# Patient Record
Sex: Female | Born: 1945 | Race: White | Hispanic: No | Marital: Married | Smoking: Never smoker
Health system: Southern US, Community
[De-identification: ages and names within clinical notes are randomized; demographics above are authoritative.]

## PROBLEM LIST (undated history)

## (undated) DIAGNOSIS — F329 Major depressive disorder, single episode, unspecified: Secondary | ICD-10-CM

## (undated) DIAGNOSIS — R7989 Other specified abnormal findings of blood chemistry: Secondary | ICD-10-CM

## (undated) DIAGNOSIS — E785 Hyperlipidemia, unspecified: Secondary | ICD-10-CM

## (undated) DIAGNOSIS — M1990 Unspecified osteoarthritis, unspecified site: Secondary | ICD-10-CM

## (undated) DIAGNOSIS — F32A Depression, unspecified: Secondary | ICD-10-CM

## (undated) DIAGNOSIS — M8580 Other specified disorders of bone density and structure, unspecified site: Secondary | ICD-10-CM

## (undated) DIAGNOSIS — Z9889 Other specified postprocedural states: Secondary | ICD-10-CM

## (undated) DIAGNOSIS — R112 Nausea with vomiting, unspecified: Secondary | ICD-10-CM

## (undated) DIAGNOSIS — S5290XA Unspecified fracture of unspecified forearm, initial encounter for closed fracture: Secondary | ICD-10-CM

## (undated) DIAGNOSIS — R945 Abnormal results of liver function studies: Secondary | ICD-10-CM

## (undated) HISTORY — PX: JOINT REPLACEMENT: SHX530

## (undated) HISTORY — PX: ABDOMINAL HYSTERECTOMY: SHX81

## (undated) HISTORY — PX: WRIST SURGERY: SHX841

---

## 1974-02-13 HISTORY — PX: MYOMECTOMY: SHX85

## 2010-01-03 ENCOUNTER — Inpatient Hospital Stay (HOSPITAL_COMMUNITY): Admission: RE | Admit: 2010-01-03 | Discharge: 2010-01-05 | Payer: Self-pay | Admitting: Orthopedic Surgery

## 2010-04-18 NOTE — Discharge Summary (Signed)
  [name], [name]              ACCOUNT NO.:  [id number]  MEDICAL RECORD NO.:  [id number]          PATIENT TYPE:  INP  LOCATION:  1618                         FACILITY:  [facility]  PHYSICIAN:  Kayla Frankel. Charlann Waters, M.D.  DATE OF BIRTH:  1945-12-23  DATE OF ADMISSION:  01/03/2010 DATE OF DISCHARGE:  01/05/2010                              DISCHARGE SUMMARY   ADMITTING DIAGNOSIS:  Left knee osteoarthritis.  DISCHARGE DIAGNOSES: 1. Left knee osteoarthritis. 2. Anxiety. 3. Depression. 4. Reflux disease.  ADMITTING HISTORY:  Kayla Waters is a 65 year old female with history of left knee osteoarthritis who failed conservative measures.  She was ready to proceed with left total knee replacement.  Risks and benefits were discussed, reviewed as well as postoperative course and expectations.  HOSPITAL COURSE:  The patient was admitted for same-day surgery for a left total knee replacement.  Please see dictated operative note for full details of the procedure.  Postoperatively, she was in the recovery room for routine stay and then transferred to orthopedic floor where she will remain for hospital stay.  Postop day #1, she was noted to have stable electrolytes and labs.  Her Hemovac drain was removed.  She was able to already do a straight leg raise.  She was seen and evaluated by Physical Therapy.  Foley removed.  She was placed on a regular diet. Other than expected, postoperative pain, she was okay.  She was seen and evaluated by a spiritual healer on her postoperative day #1 visit for repositioning and pain control.  On postop day #2 on 01/05/2010, she was ready for discharge.  She was doing well.  Her knee incision was evaluated and found to be dry.  Her hematocrit was 31.5. Electrolytes were stable.  After physical therapy, she be discharged home with the regular diet.  DISCHARGE INSTRUCTIONS:  Patient will discharged home on a regular diet. She is to follow up with [facility], with Dr. Raylene Everts in 2 weeks. She will keep her wound dry until followup.  She will be seen evaluated by home health physical therapy.  DISCHARGE MEDICATIONS: 1. Colace 100 mg p.o. b.i.d. as needed for constipation. 2. Iron sulfate 325 mg 2 to 3 times a day as tolerated for couple of     weeks for anemia. 3. Dilaudid 2 mg 1 to 2 tablets every 4 hours as needed for pain. 4. Robaxin 500 mg p.o. 6 hours as needed for muscle spasm pain. 5. MiraLax 17 g p.o. daily as needed for constipation. 6. Xarelto 10 mg p.o. daily as needed for anticoagulation. 7. Ultram 50 mg 1 to 2 tablets every 4 as needed for pain. 8. Dexlansoprazole 60 mg everyday. 9. Lexapro 10 mg q.a.m. 10.Lunesta 2 mg p.o. q.h.s. as needed. 11.Vitamin D3 daily.  Any orthopedic questions can be addressed to [facility]. Any medical issues can be addressed via her husband.     Kayla Waters, M.D.     MDO/MEDQ  D:  04/15/2010  T:  04/16/2010  Job:  [id number]  Electronically Signed by Durene Romans M.D. on 04/18/2010 09:16:30 AM

## 2010-04-26 LAB — BASIC METABOLIC PANEL
BUN: 15 mg/dL (ref 6–23)
BUN: 5 mg/dL — ABNORMAL LOW (ref 6–23)
BUN: 9 mg/dL (ref 6–23)
CO2: 28 mEq/L (ref 19–32)
CO2: 30 mEq/L (ref 19–32)
Calcium: 9 mg/dL (ref 8.4–10.5)
Calcium: 9.8 mg/dL (ref 8.4–10.5)
Chloride: 102 mEq/L (ref 96–112)
GFR calc non Af Amer: >60 mL/min (ref >60)
GFR calc non Af Amer: >60 mL/min (ref >60)
Glucose, Bld: 108 mg/dL — ABNORMAL HIGH (ref 70–99)
Glucose, Bld: 111 mg/dL — ABNORMAL HIGH (ref 70–99)
Potassium: 4.1 mEq/L (ref 3.5–5.1)
Potassium: 4.4 mEq/L (ref 3.5–5.1)
Sodium: 137 mEq/L (ref 135–145)
Sodium: 141 mEq/L (ref 135–145)

## 2010-04-26 LAB — CBC
HCT: 32.5 % — ABNORMAL LOW (ref 36.0–46.0)
HCT: 41.8 % (ref 36.0–46.0)
Hemoglobin: 11 g/dL — ABNORMAL LOW (ref 12.0–15.0)
Hemoglobin: 14.3 g/dL (ref 12.0–15.0)
MCH: 30.4 pg (ref 26.0–34.0)
MCH: 30.6 pg (ref 26.0–34.0)
MCHC: 33.9 g/dL (ref 30.0–36.0)
MCHC: 34.3 g/dL (ref 30.0–36.0)
MCV: 89.5 fL (ref 78.0–100.0)
RDW: 14.4 % (ref 11.5–15.5)
RDW: 14.6 % (ref 11.5–15.5)
RDW: 15 % (ref 11.5–15.5)
WBC: 6 10*3/uL (ref 4.0–10.5)

## 2010-04-26 LAB — PROTIME-INR
INR: 0.92 (ref 0.00–1.49)
Prothrombin Time: 12.6 seconds (ref 11.6–15.2)

## 2010-04-26 LAB — URINALYSIS, ROUTINE W REFLEX MICROSCOPIC
Nitrite: NEGATIVE
Specific Gravity, Urine: 1.012 (ref 1.005–1.030)
pH: 7 (ref 5.0–8.0)

## 2010-04-26 LAB — APTT: aPTT: 28 seconds (ref 24–37)

## 2010-04-26 LAB — DIFFERENTIAL
Basophils Absolute: 0.1 10*3/uL (ref 0.0–0.1)
Lymphocytes Relative: 30 % (ref 12–46)
Monocytes Relative: 8 % (ref 3–12)
Neutro Abs: 3.5 10*3/uL (ref 1.7–7.7)
Neutrophils Relative %: 59 % (ref 43–77)

## 2010-04-26 LAB — TYPE AND SCREEN

## 2010-04-26 LAB — SURGICAL PCR SCREEN: MRSA, PCR: NEGATIVE

## 2011-03-10 DIAGNOSIS — M1710 Unilateral primary osteoarthritis, unspecified knee: Secondary | ICD-10-CM | POA: Diagnosis not present

## 2011-03-10 DIAGNOSIS — M25569 Pain in unspecified knee: Secondary | ICD-10-CM | POA: Diagnosis not present

## 2011-03-11 DIAGNOSIS — M25469 Effusion, unspecified knee: Secondary | ICD-10-CM | POA: Diagnosis not present

## 2011-03-11 DIAGNOSIS — M1710 Unilateral primary osteoarthritis, unspecified knee: Secondary | ICD-10-CM | POA: Diagnosis not present

## 2011-03-17 DIAGNOSIS — M1710 Unilateral primary osteoarthritis, unspecified knee: Secondary | ICD-10-CM | POA: Diagnosis not present

## 2011-03-20 DIAGNOSIS — M1710 Unilateral primary osteoarthritis, unspecified knee: Secondary | ICD-10-CM | POA: Diagnosis not present

## 2011-03-23 DIAGNOSIS — M1710 Unilateral primary osteoarthritis, unspecified knee: Secondary | ICD-10-CM | POA: Diagnosis not present

## 2011-04-07 DIAGNOSIS — M1710 Unilateral primary osteoarthritis, unspecified knee: Secondary | ICD-10-CM | POA: Diagnosis not present

## 2011-04-10 DIAGNOSIS — M1710 Unilateral primary osteoarthritis, unspecified knee: Secondary | ICD-10-CM | POA: Diagnosis not present

## 2011-04-14 DIAGNOSIS — M1710 Unilateral primary osteoarthritis, unspecified knee: Secondary | ICD-10-CM | POA: Diagnosis not present

## 2011-04-17 DIAGNOSIS — M1710 Unilateral primary osteoarthritis, unspecified knee: Secondary | ICD-10-CM | POA: Diagnosis not present

## 2011-04-21 DIAGNOSIS — M1710 Unilateral primary osteoarthritis, unspecified knee: Secondary | ICD-10-CM | POA: Diagnosis not present

## 2011-04-24 DIAGNOSIS — M1710 Unilateral primary osteoarthritis, unspecified knee: Secondary | ICD-10-CM | POA: Diagnosis not present

## 2011-04-28 DIAGNOSIS — M1710 Unilateral primary osteoarthritis, unspecified knee: Secondary | ICD-10-CM | POA: Diagnosis not present

## 2011-04-28 DIAGNOSIS — M6580 Other synovitis and tenosynovitis, unspecified site: Secondary | ICD-10-CM | POA: Diagnosis not present

## 2011-06-13 DIAGNOSIS — M899 Disorder of bone, unspecified: Secondary | ICD-10-CM | POA: Diagnosis not present

## 2011-06-13 DIAGNOSIS — Z79899 Other long term (current) drug therapy: Secondary | ICD-10-CM | POA: Diagnosis not present

## 2011-06-13 DIAGNOSIS — R7989 Other specified abnormal findings of blood chemistry: Secondary | ICD-10-CM | POA: Diagnosis not present

## 2011-06-13 DIAGNOSIS — E559 Vitamin D deficiency, unspecified: Secondary | ICD-10-CM | POA: Diagnosis not present

## 2011-06-20 DIAGNOSIS — E559 Vitamin D deficiency, unspecified: Secondary | ICD-10-CM | POA: Diagnosis not present

## 2011-06-20 DIAGNOSIS — M1710 Unilateral primary osteoarthritis, unspecified knee: Secondary | ICD-10-CM | POA: Diagnosis not present

## 2011-06-20 DIAGNOSIS — J309 Allergic rhinitis, unspecified: Secondary | ICD-10-CM | POA: Diagnosis not present

## 2011-06-20 DIAGNOSIS — R7989 Other specified abnormal findings of blood chemistry: Secondary | ICD-10-CM | POA: Diagnosis not present

## 2012-02-28 DIAGNOSIS — M899 Disorder of bone, unspecified: Secondary | ICD-10-CM | POA: Diagnosis not present

## 2012-02-28 DIAGNOSIS — R3989 Other symptoms and signs involving the genitourinary system: Secondary | ICD-10-CM | POA: Diagnosis not present

## 2012-02-28 DIAGNOSIS — Z79899 Other long term (current) drug therapy: Secondary | ICD-10-CM | POA: Diagnosis not present

## 2012-02-28 DIAGNOSIS — R7989 Other specified abnormal findings of blood chemistry: Secondary | ICD-10-CM | POA: Diagnosis not present

## 2012-02-28 DIAGNOSIS — K8020 Calculus of gallbladder without cholecystitis without obstruction: Secondary | ICD-10-CM | POA: Diagnosis not present

## 2012-02-28 DIAGNOSIS — E559 Vitamin D deficiency, unspecified: Secondary | ICD-10-CM | POA: Diagnosis not present

## 2012-02-28 DIAGNOSIS — K7689 Other specified diseases of liver: Secondary | ICD-10-CM | POA: Diagnosis not present

## 2012-04-08 DIAGNOSIS — Z1231 Encounter for screening mammogram for malignant neoplasm of breast: Secondary | ICD-10-CM | POA: Diagnosis not present

## 2012-05-26 DIAGNOSIS — J019 Acute sinusitis, unspecified: Secondary | ICD-10-CM | POA: Diagnosis not present

## 2013-03-26 DIAGNOSIS — M79609 Pain in unspecified limb: Secondary | ICD-10-CM | POA: Diagnosis not present

## 2013-03-26 DIAGNOSIS — M19049 Primary osteoarthritis, unspecified hand: Secondary | ICD-10-CM | POA: Diagnosis not present

## 2013-03-28 DIAGNOSIS — R509 Fever, unspecified: Secondary | ICD-10-CM | POA: Diagnosis not present

## 2013-04-07 DIAGNOSIS — M79609 Pain in unspecified limb: Secondary | ICD-10-CM | POA: Diagnosis not present

## 2013-04-14 DIAGNOSIS — M19049 Primary osteoarthritis, unspecified hand: Secondary | ICD-10-CM | POA: Diagnosis not present

## 2013-04-14 DIAGNOSIS — G5600 Carpal tunnel syndrome, unspecified upper limb: Secondary | ICD-10-CM | POA: Diagnosis not present

## 2013-04-14 DIAGNOSIS — M79609 Pain in unspecified limb: Secondary | ICD-10-CM | POA: Diagnosis not present

## 2013-06-02 DIAGNOSIS — G5600 Carpal tunnel syndrome, unspecified upper limb: Secondary | ICD-10-CM | POA: Diagnosis not present

## 2013-06-02 DIAGNOSIS — M79609 Pain in unspecified limb: Secondary | ICD-10-CM | POA: Diagnosis not present

## 2013-10-03 DIAGNOSIS — R229 Localized swelling, mass and lump, unspecified: Secondary | ICD-10-CM | POA: Diagnosis not present

## 2013-10-03 DIAGNOSIS — G5600 Carpal tunnel syndrome, unspecified upper limb: Secondary | ICD-10-CM | POA: Diagnosis not present

## 2013-10-14 DIAGNOSIS — M6740 Ganglion, unspecified site: Secondary | ICD-10-CM | POA: Diagnosis not present

## 2013-10-14 DIAGNOSIS — G5600 Carpal tunnel syndrome, unspecified upper limb: Secondary | ICD-10-CM | POA: Diagnosis not present

## 2013-10-14 DIAGNOSIS — D492 Neoplasm of unspecified behavior of bone, soft tissue, and skin: Secondary | ICD-10-CM | POA: Diagnosis not present

## 2013-10-14 DIAGNOSIS — G8918 Other acute postprocedural pain: Secondary | ICD-10-CM | POA: Diagnosis not present

## 2013-11-03 DIAGNOSIS — G5600 Carpal tunnel syndrome, unspecified upper limb: Secondary | ICD-10-CM | POA: Diagnosis not present

## 2013-11-07 DIAGNOSIS — K8020 Calculus of gallbladder without cholecystitis without obstruction: Secondary | ICD-10-CM | POA: Diagnosis not present

## 2013-11-07 DIAGNOSIS — E049 Nontoxic goiter, unspecified: Secondary | ICD-10-CM | POA: Diagnosis not present

## 2013-11-07 DIAGNOSIS — Z13220 Encounter for screening for lipoid disorders: Secondary | ICD-10-CM | POA: Diagnosis not present

## 2013-11-07 DIAGNOSIS — Z0000 Encounter for general adult medical examination without abnormal findings: Secondary | ICD-10-CM | POA: Diagnosis not present

## 2013-11-07 DIAGNOSIS — R3919 Other difficulties with micturition: Secondary | ICD-10-CM | POA: Diagnosis not present

## 2013-11-07 DIAGNOSIS — F329 Major depressive disorder, single episode, unspecified: Secondary | ICD-10-CM | POA: Diagnosis not present

## 2013-11-07 DIAGNOSIS — R7989 Other specified abnormal findings of blood chemistry: Secondary | ICD-10-CM | POA: Diagnosis not present

## 2013-11-07 DIAGNOSIS — E559 Vitamin D deficiency, unspecified: Secondary | ICD-10-CM | POA: Diagnosis not present

## 2013-11-07 DIAGNOSIS — Z23 Encounter for immunization: Secondary | ICD-10-CM | POA: Diagnosis not present

## 2013-11-08 DIAGNOSIS — G5600 Carpal tunnel syndrome, unspecified upper limb: Secondary | ICD-10-CM | POA: Diagnosis not present

## 2013-11-14 ENCOUNTER — Other Ambulatory Visit: Payer: Self-pay | Admitting: Family Medicine

## 2013-11-14 DIAGNOSIS — E049 Nontoxic goiter, unspecified: Secondary | ICD-10-CM

## 2013-11-14 DIAGNOSIS — G5602 Carpal tunnel syndrome, left upper limb: Secondary | ICD-10-CM | POA: Diagnosis not present

## 2013-11-17 DIAGNOSIS — G5602 Carpal tunnel syndrome, left upper limb: Secondary | ICD-10-CM | POA: Diagnosis not present

## 2013-11-18 ENCOUNTER — Other Ambulatory Visit: Payer: Self-pay | Admitting: Family Medicine

## 2013-11-18 DIAGNOSIS — Z1231 Encounter for screening mammogram for malignant neoplasm of breast: Secondary | ICD-10-CM

## 2013-11-19 ENCOUNTER — Other Ambulatory Visit: Payer: Self-pay

## 2013-11-20 DIAGNOSIS — Z1211 Encounter for screening for malignant neoplasm of colon: Secondary | ICD-10-CM | POA: Diagnosis not present

## 2013-11-20 DIAGNOSIS — G5602 Carpal tunnel syndrome, left upper limb: Secondary | ICD-10-CM | POA: Diagnosis not present

## 2013-11-24 ENCOUNTER — Ambulatory Visit
Admission: RE | Admit: 2013-11-24 | Discharge: 2013-11-24 | Disposition: A | Discharge status: Home / Self Care | Payer: Medicare Other | Source: Ambulatory Visit | Attending: Family Medicine | Admitting: Family Medicine

## 2013-11-24 DIAGNOSIS — E041 Nontoxic single thyroid nodule: Secondary | ICD-10-CM | POA: Diagnosis not present

## 2013-11-24 DIAGNOSIS — E049 Nontoxic goiter, unspecified: Secondary | ICD-10-CM

## 2013-11-28 DIAGNOSIS — G5602 Carpal tunnel syndrome, left upper limb: Secondary | ICD-10-CM | POA: Diagnosis not present

## 2013-12-11 ENCOUNTER — Ambulatory Visit: Payer: Self-pay

## 2013-12-25 ENCOUNTER — Ambulatory Visit
Admission: RE | Admit: 2013-12-25 | Discharge: 2013-12-25 | Disposition: A | Discharge status: Home / Self Care | Payer: Medicare Other | Source: Ambulatory Visit | Attending: Family Medicine | Admitting: Family Medicine

## 2013-12-25 ENCOUNTER — Encounter (INDEPENDENT_AMBULATORY_CARE_PROVIDER_SITE_OTHER): Payer: Self-pay

## 2013-12-25 DIAGNOSIS — Z1231 Encounter for screening mammogram for malignant neoplasm of breast: Secondary | ICD-10-CM

## 2014-01-15 DIAGNOSIS — R3912 Poor urinary stream: Secondary | ICD-10-CM | POA: Diagnosis not present

## 2014-10-12 DIAGNOSIS — Z1211 Encounter for screening for malignant neoplasm of colon: Secondary | ICD-10-CM | POA: Diagnosis not present

## 2015-03-13 IMAGING — US US SOFT TISSUE HEAD/NECK
1 series · 14 of 25 positions shown · non-contrast
Comparison: None.

CLINICAL DATA: 68-year-old female

EXAM:
THYROID ULTRASOUND
TECHNIQUE: Ultrasound examination of the thyroid gland and adjacent soft
tissues was performed.

[Series 1: us soft tissue head/neck · 0.06mm/px · 14 of 34 slices shown]
[im 1/34]
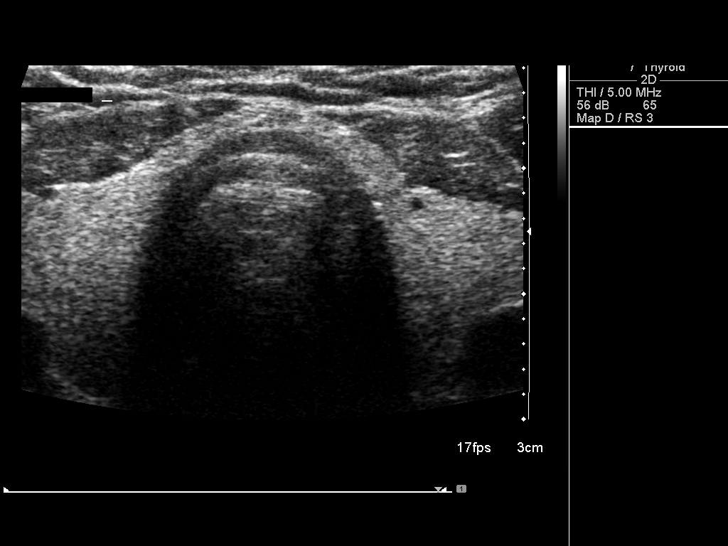
[im 3/34]
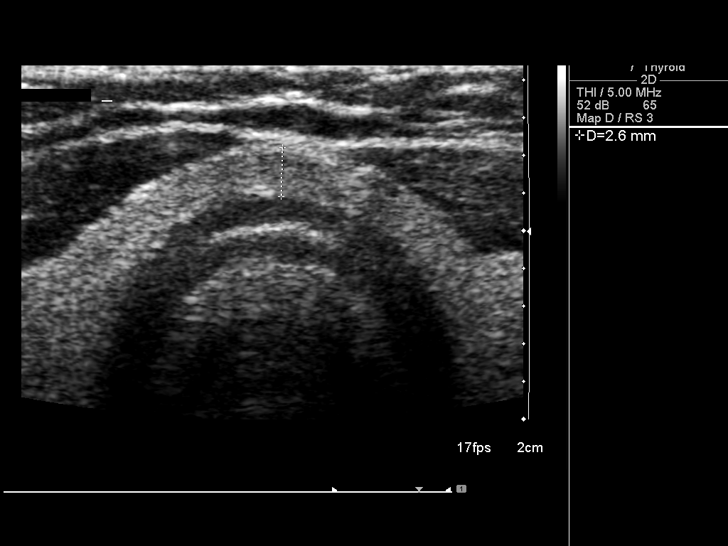
[im 6/34]
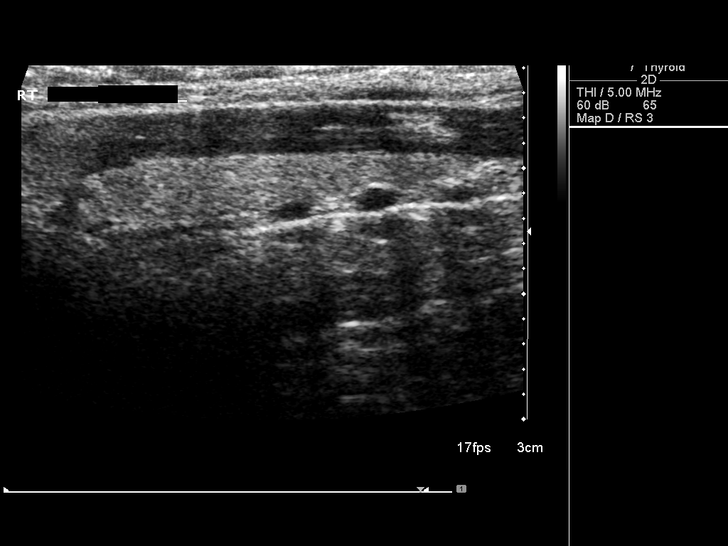
[im 9/34]
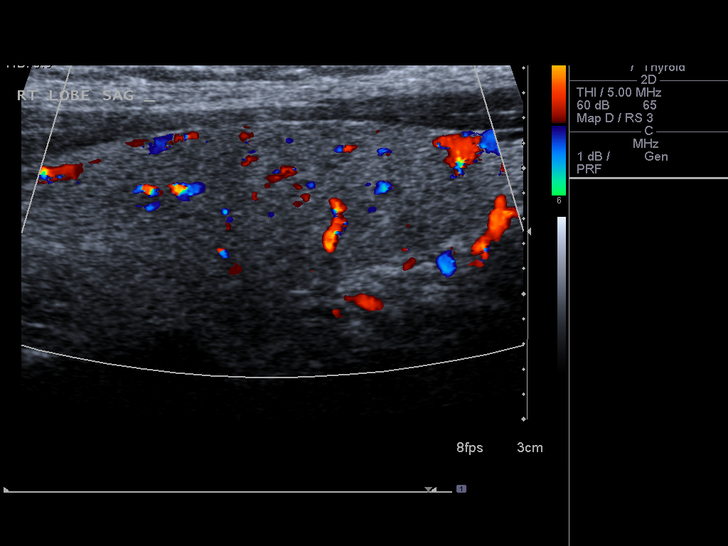
[im 12/34]
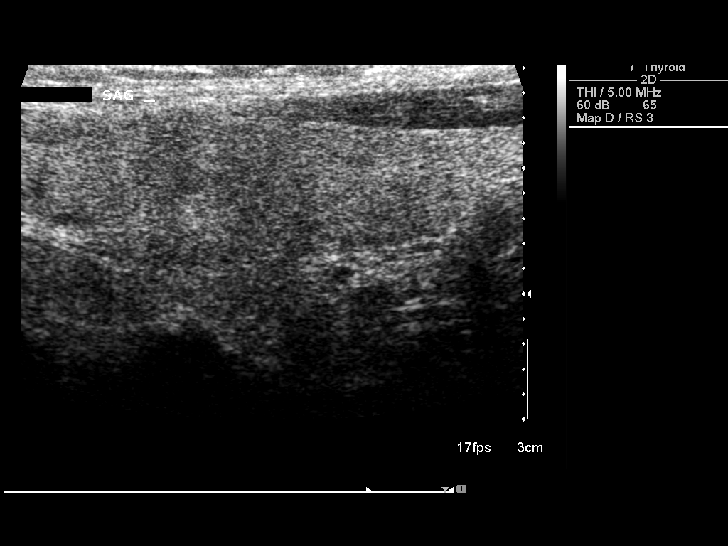
[im 13/34]
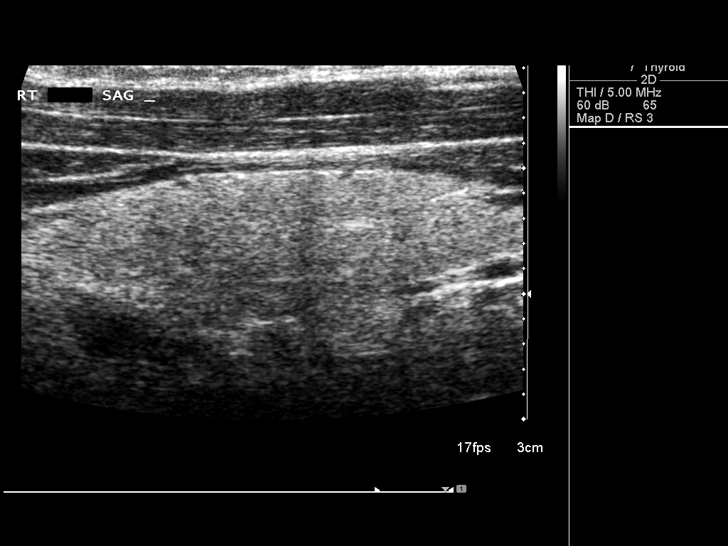
[im 16/34]
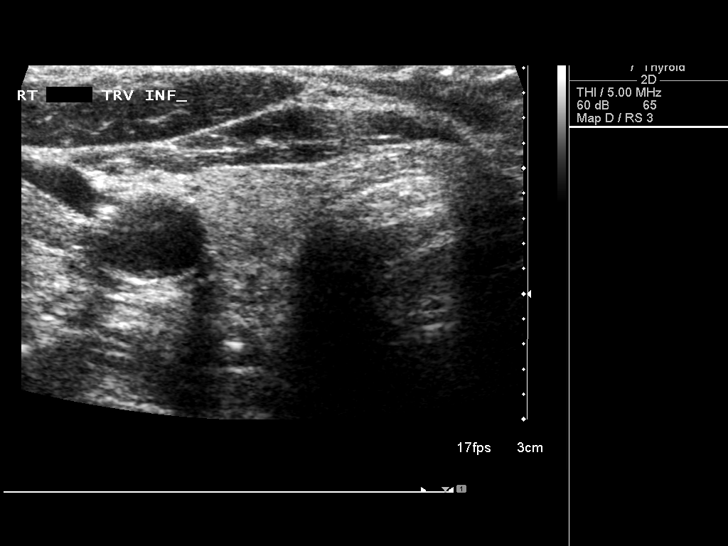
[im 18/34]
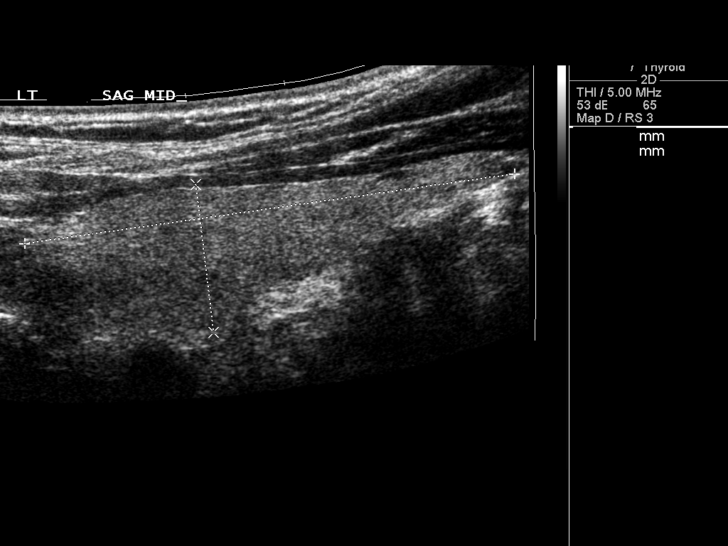
[im 21/34]
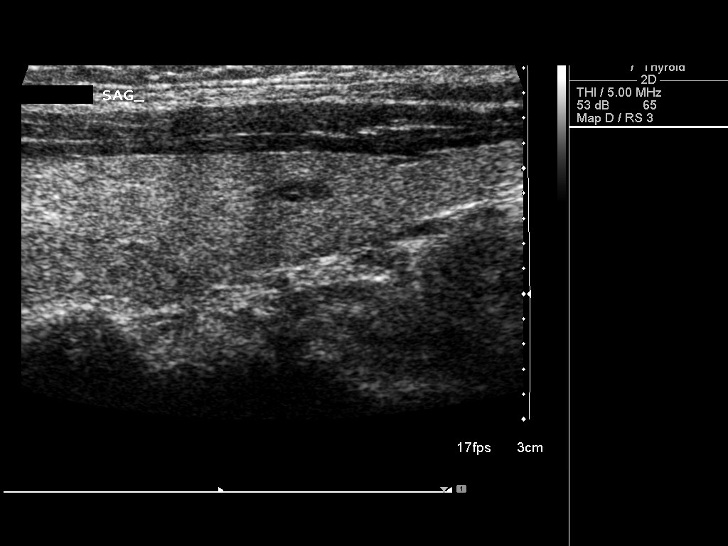
[im 23/34]
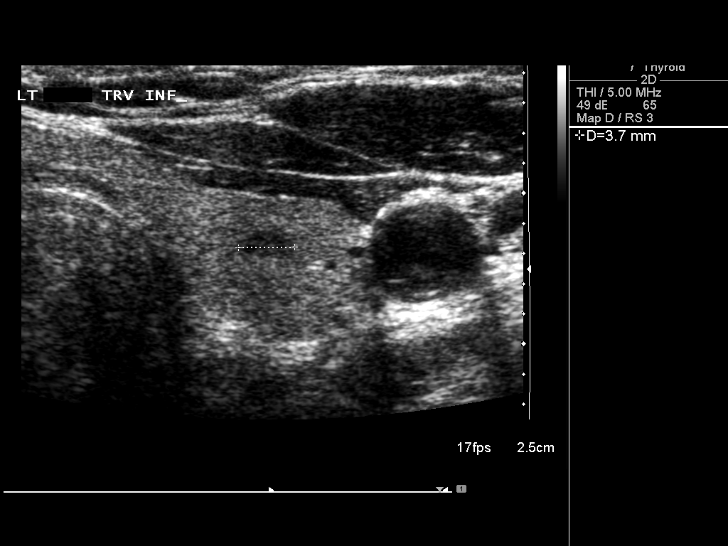
[im 25/34]
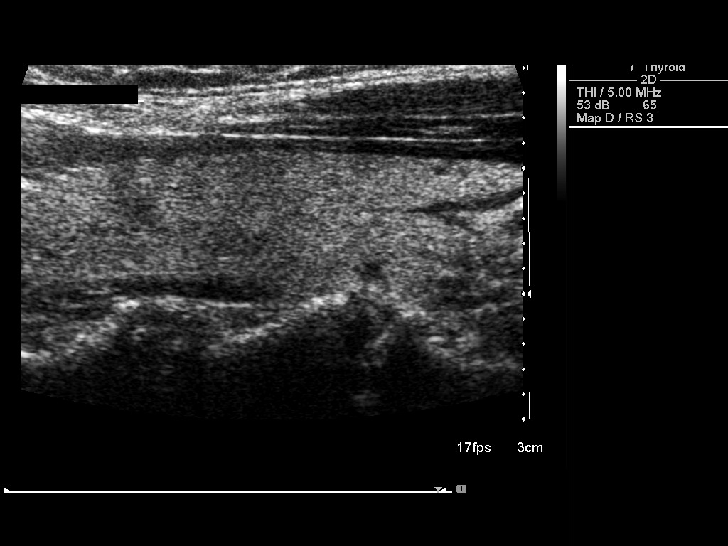
[im 28/34]
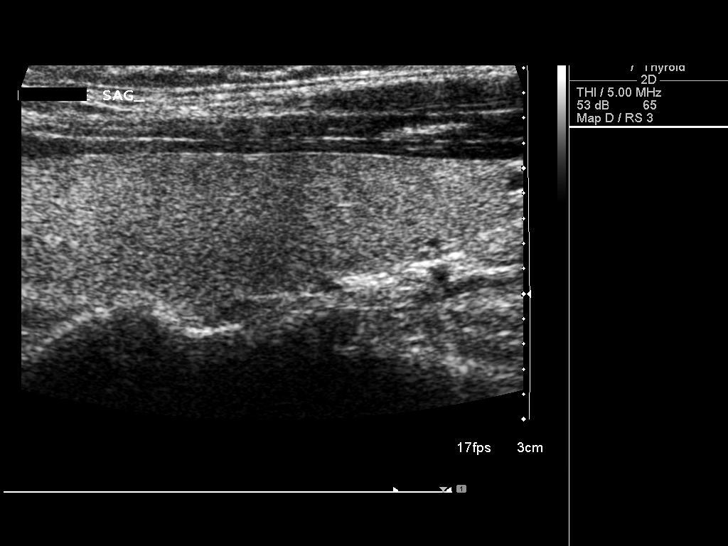
[im 31/34]
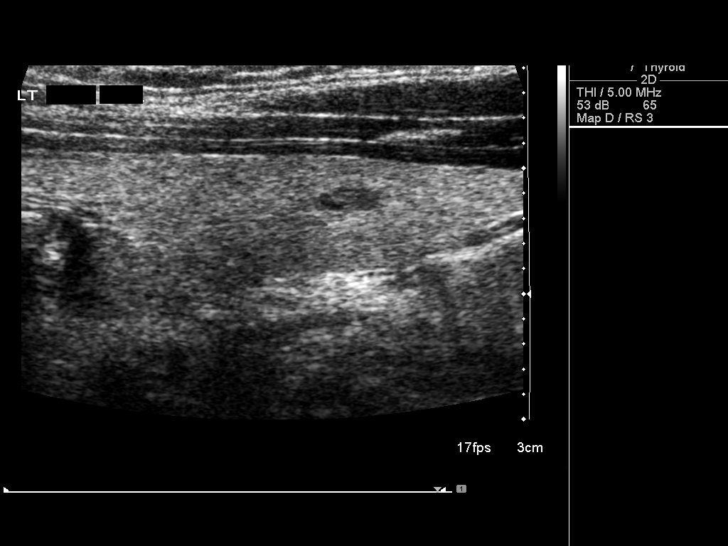
[im 34/34]
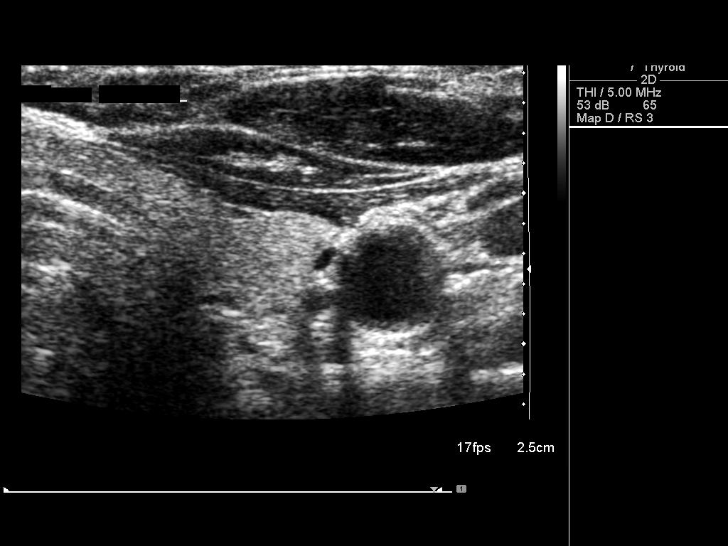

[14 of 25 positions shown; findings below may reference images not displayed]

FINDINGS: Right thyroid lobe

Measurements: 5.3 cm, 1.0 cm, 1.7 cm.  No nodules visualized.

Left thyroid lobe

Measurements: 5.0 cm, 1.5 cm, 1.4 cm. Single hypoechoic sub cm
nodule on the inferior pole of left thyroid measuring 4 mm x 2 mm x
4 mm.

Isthmus

Thickness: 3 mm.  No nodules visualized.

Lymphadenopathy

None visualized.
IMPRESSION: Single left-sided nodule measuring 4 mm without suspicious features.

Findings do not meet current SRU consensus criteria for biopsy.
Follow-up by clinical exam is recommended. If patient has known risk
factors for thyroid carcinoma, consider follow-up ultrasound in 12
months. If patient is clinically hyperthyroid, consider nuclear
medicine thyroid uptake and scan.Reference: Management of Thyroid
Nodules Detected at US: Society of Radiologists in Ultrasound

## 2015-04-16 DIAGNOSIS — E049 Nontoxic goiter, unspecified: Secondary | ICD-10-CM | POA: Diagnosis not present

## 2015-04-16 DIAGNOSIS — Z0000 Encounter for general adult medical examination without abnormal findings: Secondary | ICD-10-CM | POA: Diagnosis not present

## 2015-04-16 DIAGNOSIS — E559 Vitamin D deficiency, unspecified: Secondary | ICD-10-CM | POA: Diagnosis not present

## 2015-04-16 DIAGNOSIS — K7581 Nonalcoholic steatohepatitis (NASH): Secondary | ICD-10-CM | POA: Diagnosis not present

## 2015-04-16 DIAGNOSIS — E7800 Pure hypercholesterolemia, unspecified: Secondary | ICD-10-CM | POA: Diagnosis not present

## 2015-04-16 DIAGNOSIS — M179 Osteoarthritis of knee, unspecified: Secondary | ICD-10-CM | POA: Diagnosis not present

## 2015-04-16 DIAGNOSIS — Z23 Encounter for immunization: Secondary | ICD-10-CM | POA: Diagnosis not present

## 2015-04-16 DIAGNOSIS — M8580 Other specified disorders of bone density and structure, unspecified site: Secondary | ICD-10-CM | POA: Diagnosis not present

## 2015-04-16 DIAGNOSIS — F322 Major depressive disorder, single episode, severe without psychotic features: Secondary | ICD-10-CM | POA: Diagnosis not present

## 2015-04-19 ENCOUNTER — Other Ambulatory Visit: Payer: Self-pay | Admitting: Family Medicine

## 2015-04-19 DIAGNOSIS — E049 Nontoxic goiter, unspecified: Secondary | ICD-10-CM

## 2015-04-19 DIAGNOSIS — Z1231 Encounter for screening mammogram for malignant neoplasm of breast: Secondary | ICD-10-CM

## 2015-04-19 DIAGNOSIS — M8580 Other specified disorders of bone density and structure, unspecified site: Secondary | ICD-10-CM

## 2015-05-13 ENCOUNTER — Ambulatory Visit
Admission: RE | Admit: 2015-05-13 | Discharge: 2015-05-13 | Disposition: A | Discharge status: Home / Self Care | Payer: Medicare Other | Source: Ambulatory Visit | Attending: Family Medicine | Admitting: Family Medicine

## 2015-05-13 DIAGNOSIS — Z1231 Encounter for screening mammogram for malignant neoplasm of breast: Secondary | ICD-10-CM

## 2015-05-13 DIAGNOSIS — Z780 Asymptomatic menopausal state: Secondary | ICD-10-CM | POA: Diagnosis not present

## 2015-05-13 DIAGNOSIS — E049 Nontoxic goiter, unspecified: Secondary | ICD-10-CM

## 2015-05-13 DIAGNOSIS — E041 Nontoxic single thyroid nodule: Secondary | ICD-10-CM | POA: Diagnosis not present

## 2015-05-13 DIAGNOSIS — M8580 Other specified disorders of bone density and structure, unspecified site: Secondary | ICD-10-CM

## 2015-05-13 DIAGNOSIS — M8589 Other specified disorders of bone density and structure, multiple sites: Secondary | ICD-10-CM | POA: Diagnosis not present

## 2015-05-17 ENCOUNTER — Other Ambulatory Visit: Payer: Self-pay | Admitting: Family Medicine

## 2015-05-17 DIAGNOSIS — R928 Other abnormal and inconclusive findings on diagnostic imaging of breast: Secondary | ICD-10-CM

## 2015-05-25 ENCOUNTER — Ambulatory Visit
Admission: RE | Admit: 2015-05-25 | Discharge: 2015-05-25 | Disposition: A | Discharge status: Home / Self Care | Payer: Medicare Other | Source: Ambulatory Visit | Attending: Family Medicine | Admitting: Family Medicine

## 2015-05-25 DIAGNOSIS — R928 Other abnormal and inconclusive findings on diagnostic imaging of breast: Secondary | ICD-10-CM

## 2015-05-25 DIAGNOSIS — R922 Inconclusive mammogram: Secondary | ICD-10-CM | POA: Diagnosis not present

## 2015-08-27 DIAGNOSIS — M542 Cervicalgia: Secondary | ICD-10-CM | POA: Diagnosis not present

## 2015-09-11 ENCOUNTER — Other Ambulatory Visit: Payer: Self-pay | Admitting: Family Medicine

## 2015-09-11 DIAGNOSIS — M542 Cervicalgia: Secondary | ICD-10-CM

## 2015-09-18 DIAGNOSIS — M50821 Other cervical disc disorders at C4-C5 level: Secondary | ICD-10-CM | POA: Diagnosis not present

## 2015-09-18 DIAGNOSIS — M50823 Other cervical disc disorders at C6-C7 level: Secondary | ICD-10-CM | POA: Diagnosis not present

## 2015-09-18 DIAGNOSIS — M50822 Other cervical disc disorders at C5-C6 level: Secondary | ICD-10-CM | POA: Diagnosis not present

## 2015-09-25 ENCOUNTER — Ambulatory Visit
Admission: RE | Admit: 2015-09-25 | Discharge: 2015-09-25 | Disposition: A | Discharge status: Home / Self Care | Payer: Medicare Other | Source: Ambulatory Visit | Attending: Family Medicine | Admitting: Family Medicine

## 2015-09-25 DIAGNOSIS — M50221 Other cervical disc displacement at C4-C5 level: Secondary | ICD-10-CM | POA: Diagnosis not present

## 2015-09-25 DIAGNOSIS — M542 Cervicalgia: Secondary | ICD-10-CM

## 2015-09-25 DIAGNOSIS — M50223 Other cervical disc displacement at C6-C7 level: Secondary | ICD-10-CM | POA: Diagnosis not present

## 2015-09-25 DIAGNOSIS — M50222 Other cervical disc displacement at C5-C6 level: Secondary | ICD-10-CM | POA: Diagnosis not present

## 2015-10-15 DIAGNOSIS — M50821 Other cervical disc disorders at C4-C5 level: Secondary | ICD-10-CM | POA: Diagnosis not present

## 2015-10-15 DIAGNOSIS — M50822 Other cervical disc disorders at C5-C6 level: Secondary | ICD-10-CM | POA: Diagnosis not present

## 2015-10-15 DIAGNOSIS — M50823 Other cervical disc disorders at C6-C7 level: Secondary | ICD-10-CM | POA: Diagnosis not present

## 2015-10-19 DIAGNOSIS — M50822 Other cervical disc disorders at C5-C6 level: Secondary | ICD-10-CM | POA: Diagnosis not present

## 2015-10-19 DIAGNOSIS — M50823 Other cervical disc disorders at C6-C7 level: Secondary | ICD-10-CM | POA: Diagnosis not present

## 2015-10-19 DIAGNOSIS — M50821 Other cervical disc disorders at C4-C5 level: Secondary | ICD-10-CM | POA: Diagnosis not present

## 2015-10-22 DIAGNOSIS — M50823 Other cervical disc disorders at C6-C7 level: Secondary | ICD-10-CM | POA: Diagnosis not present

## 2015-10-22 DIAGNOSIS — M50822 Other cervical disc disorders at C5-C6 level: Secondary | ICD-10-CM | POA: Diagnosis not present

## 2015-10-22 DIAGNOSIS — M50821 Other cervical disc disorders at C4-C5 level: Secondary | ICD-10-CM | POA: Diagnosis not present

## 2015-10-25 DIAGNOSIS — M50823 Other cervical disc disorders at C6-C7 level: Secondary | ICD-10-CM | POA: Diagnosis not present

## 2015-10-25 DIAGNOSIS — M50821 Other cervical disc disorders at C4-C5 level: Secondary | ICD-10-CM | POA: Diagnosis not present

## 2015-10-25 DIAGNOSIS — M50822 Other cervical disc disorders at C5-C6 level: Secondary | ICD-10-CM | POA: Diagnosis not present

## 2015-10-27 DIAGNOSIS — M50823 Other cervical disc disorders at C6-C7 level: Secondary | ICD-10-CM | POA: Diagnosis not present

## 2015-10-27 DIAGNOSIS — M50822 Other cervical disc disorders at C5-C6 level: Secondary | ICD-10-CM | POA: Diagnosis not present

## 2015-10-27 DIAGNOSIS — M50821 Other cervical disc disorders at C4-C5 level: Secondary | ICD-10-CM | POA: Diagnosis not present

## 2015-10-29 DIAGNOSIS — M50821 Other cervical disc disorders at C4-C5 level: Secondary | ICD-10-CM | POA: Diagnosis not present

## 2015-10-29 DIAGNOSIS — M50822 Other cervical disc disorders at C5-C6 level: Secondary | ICD-10-CM | POA: Diagnosis not present

## 2015-10-29 DIAGNOSIS — M50823 Other cervical disc disorders at C6-C7 level: Secondary | ICD-10-CM | POA: Diagnosis not present

## 2015-11-01 DIAGNOSIS — M50821 Other cervical disc disorders at C4-C5 level: Secondary | ICD-10-CM | POA: Diagnosis not present

## 2015-11-01 DIAGNOSIS — M50823 Other cervical disc disorders at C6-C7 level: Secondary | ICD-10-CM | POA: Diagnosis not present

## 2015-11-01 DIAGNOSIS — M50822 Other cervical disc disorders at C5-C6 level: Secondary | ICD-10-CM | POA: Diagnosis not present

## 2015-11-03 DIAGNOSIS — M50823 Other cervical disc disorders at C6-C7 level: Secondary | ICD-10-CM | POA: Diagnosis not present

## 2015-11-03 DIAGNOSIS — M50821 Other cervical disc disorders at C4-C5 level: Secondary | ICD-10-CM | POA: Diagnosis not present

## 2015-11-03 DIAGNOSIS — M50822 Other cervical disc disorders at C5-C6 level: Secondary | ICD-10-CM | POA: Diagnosis not present

## 2015-11-05 DIAGNOSIS — M50823 Other cervical disc disorders at C6-C7 level: Secondary | ICD-10-CM | POA: Diagnosis not present

## 2015-11-05 DIAGNOSIS — M50822 Other cervical disc disorders at C5-C6 level: Secondary | ICD-10-CM | POA: Diagnosis not present

## 2015-11-05 DIAGNOSIS — M50821 Other cervical disc disorders at C4-C5 level: Secondary | ICD-10-CM | POA: Diagnosis not present

## 2015-11-29 DIAGNOSIS — M50822 Other cervical disc disorders at C5-C6 level: Secondary | ICD-10-CM | POA: Diagnosis not present

## 2015-11-29 DIAGNOSIS — M50821 Other cervical disc disorders at C4-C5 level: Secondary | ICD-10-CM | POA: Diagnosis not present

## 2015-11-29 DIAGNOSIS — M50823 Other cervical disc disorders at C6-C7 level: Secondary | ICD-10-CM | POA: Diagnosis not present

## 2015-12-01 DIAGNOSIS — M8580 Other specified disorders of bone density and structure, unspecified site: Secondary | ICD-10-CM | POA: Diagnosis not present

## 2015-12-01 DIAGNOSIS — M50821 Other cervical disc disorders at C4-C5 level: Secondary | ICD-10-CM | POA: Diagnosis not present

## 2015-12-01 DIAGNOSIS — K7581 Nonalcoholic steatohepatitis (NASH): Secondary | ICD-10-CM | POA: Diagnosis not present

## 2015-12-01 DIAGNOSIS — F322 Major depressive disorder, single episode, severe without psychotic features: Secondary | ICD-10-CM | POA: Diagnosis not present

## 2015-12-01 DIAGNOSIS — E559 Vitamin D deficiency, unspecified: Secondary | ICD-10-CM | POA: Diagnosis not present

## 2015-12-01 DIAGNOSIS — M47812 Spondylosis without myelopathy or radiculopathy, cervical region: Secondary | ICD-10-CM | POA: Diagnosis not present

## 2016-02-10 DIAGNOSIS — M25562 Pain in left knee: Secondary | ICD-10-CM | POA: Diagnosis not present

## 2016-02-10 DIAGNOSIS — Z96652 Presence of left artificial knee joint: Secondary | ICD-10-CM | POA: Diagnosis not present

## 2016-02-10 DIAGNOSIS — M25561 Pain in right knee: Secondary | ICD-10-CM | POA: Diagnosis not present

## 2016-02-10 DIAGNOSIS — M25551 Pain in right hip: Secondary | ICD-10-CM | POA: Diagnosis not present

## 2016-02-10 DIAGNOSIS — Z471 Aftercare following joint replacement surgery: Secondary | ICD-10-CM | POA: Diagnosis not present

## 2016-03-06 DIAGNOSIS — M25561 Pain in right knee: Secondary | ICD-10-CM | POA: Diagnosis not present

## 2016-03-06 DIAGNOSIS — M5136 Other intervertebral disc degeneration, lumbar region: Secondary | ICD-10-CM | POA: Diagnosis not present

## 2016-03-06 DIAGNOSIS — M1711 Unilateral primary osteoarthritis, right knee: Secondary | ICD-10-CM | POA: Diagnosis not present

## 2016-03-06 DIAGNOSIS — M7061 Trochanteric bursitis, right hip: Secondary | ICD-10-CM | POA: Diagnosis not present

## 2016-05-04 DIAGNOSIS — M1711 Unilateral primary osteoarthritis, right knee: Secondary | ICD-10-CM | POA: Diagnosis not present

## 2016-05-04 DIAGNOSIS — G8929 Other chronic pain: Secondary | ICD-10-CM | POA: Diagnosis not present

## 2016-05-04 DIAGNOSIS — M25561 Pain in right knee: Secondary | ICD-10-CM | POA: Diagnosis not present

## 2016-05-24 ENCOUNTER — Other Ambulatory Visit: Payer: Self-pay | Admitting: Family Medicine

## 2016-05-24 DIAGNOSIS — Z1231 Encounter for screening mammogram for malignant neoplasm of breast: Secondary | ICD-10-CM

## 2016-06-02 DIAGNOSIS — M1711 Unilateral primary osteoarthritis, right knee: Secondary | ICD-10-CM | POA: Diagnosis not present

## 2016-06-16 ENCOUNTER — Ambulatory Visit
Admission: RE | Admit: 2016-06-16 | Discharge: 2016-06-16 | Disposition: A | Discharge status: Home / Self Care | Payer: Medicare Other | Source: Ambulatory Visit | Attending: Family Medicine | Admitting: Family Medicine

## 2016-06-16 DIAGNOSIS — Z1231 Encounter for screening mammogram for malignant neoplasm of breast: Secondary | ICD-10-CM

## 2016-06-22 DIAGNOSIS — R21 Rash and other nonspecific skin eruption: Secondary | ICD-10-CM | POA: Diagnosis not present

## 2016-06-22 DIAGNOSIS — E559 Vitamin D deficiency, unspecified: Secondary | ICD-10-CM | POA: Diagnosis not present

## 2016-06-22 DIAGNOSIS — M8580 Other specified disorders of bone density and structure, unspecified site: Secondary | ICD-10-CM | POA: Diagnosis not present

## 2016-06-22 DIAGNOSIS — E041 Nontoxic single thyroid nodule: Secondary | ICD-10-CM | POA: Diagnosis not present

## 2016-06-22 DIAGNOSIS — Z0000 Encounter for general adult medical examination without abnormal findings: Secondary | ICD-10-CM | POA: Diagnosis not present

## 2016-06-22 DIAGNOSIS — E2839 Other primary ovarian failure: Secondary | ICD-10-CM | POA: Diagnosis not present

## 2016-06-22 DIAGNOSIS — Z23 Encounter for immunization: Secondary | ICD-10-CM | POA: Diagnosis not present

## 2016-06-22 DIAGNOSIS — F322 Major depressive disorder, single episode, severe without psychotic features: Secondary | ICD-10-CM | POA: Diagnosis not present

## 2016-06-22 DIAGNOSIS — N951 Menopausal and female climacteric states: Secondary | ICD-10-CM | POA: Diagnosis not present

## 2016-06-22 DIAGNOSIS — R252 Cramp and spasm: Secondary | ICD-10-CM | POA: Diagnosis not present

## 2016-06-22 DIAGNOSIS — K7581 Nonalcoholic steatohepatitis (NASH): Secondary | ICD-10-CM | POA: Diagnosis not present

## 2016-06-26 ENCOUNTER — Other Ambulatory Visit: Payer: Self-pay | Admitting: Family Medicine

## 2016-06-26 DIAGNOSIS — D485 Neoplasm of uncertain behavior of skin: Secondary | ICD-10-CM | POA: Diagnosis not present

## 2016-06-26 DIAGNOSIS — Z8639 Personal history of other endocrine, nutritional and metabolic disease: Secondary | ICD-10-CM

## 2016-06-26 DIAGNOSIS — L814 Other melanin hyperpigmentation: Secondary | ICD-10-CM | POA: Diagnosis not present

## 2016-06-26 DIAGNOSIS — L821 Other seborrheic keratosis: Secondary | ICD-10-CM | POA: Diagnosis not present

## 2016-07-03 ENCOUNTER — Other Ambulatory Visit: Payer: Medicare Other

## 2016-07-12 DIAGNOSIS — R748 Abnormal levels of other serum enzymes: Secondary | ICD-10-CM | POA: Diagnosis not present

## 2016-07-12 DIAGNOSIS — R7989 Other specified abnormal findings of blood chemistry: Secondary | ICD-10-CM | POA: Diagnosis not present

## 2016-07-12 DIAGNOSIS — R197 Diarrhea, unspecified: Secondary | ICD-10-CM | POA: Diagnosis not present

## 2016-07-17 ENCOUNTER — Other Ambulatory Visit: Payer: Self-pay | Admitting: Family Medicine

## 2016-07-17 DIAGNOSIS — R748 Abnormal levels of other serum enzymes: Secondary | ICD-10-CM

## 2016-07-27 ENCOUNTER — Other Ambulatory Visit: Payer: Medicare Other

## 2016-08-01 ENCOUNTER — Ambulatory Visit
Admission: RE | Admit: 2016-08-01 | Discharge: 2016-08-01 | Disposition: A | Discharge status: Home / Self Care | Payer: Medicare Other | Source: Ambulatory Visit | Attending: Family Medicine | Admitting: Family Medicine

## 2016-08-01 DIAGNOSIS — R748 Abnormal levels of other serum enzymes: Secondary | ICD-10-CM

## 2016-08-01 DIAGNOSIS — Z8639 Personal history of other endocrine, nutritional and metabolic disease: Secondary | ICD-10-CM

## 2016-08-01 DIAGNOSIS — K8020 Calculus of gallbladder without cholecystitis without obstruction: Secondary | ICD-10-CM | POA: Diagnosis not present

## 2016-08-01 DIAGNOSIS — E042 Nontoxic multinodular goiter: Secondary | ICD-10-CM | POA: Diagnosis not present

## 2016-08-03 DIAGNOSIS — M25561 Pain in right knee: Secondary | ICD-10-CM | POA: Diagnosis not present

## 2016-08-03 DIAGNOSIS — G8929 Other chronic pain: Secondary | ICD-10-CM | POA: Diagnosis not present

## 2016-08-03 DIAGNOSIS — M1711 Unilateral primary osteoarthritis, right knee: Secondary | ICD-10-CM | POA: Diagnosis not present

## 2016-08-29 IMAGING — US US SOFT TISSUE HEAD/NECK
1 series · 13 of 25 positions shown · non-contrast
Comparison: Thyroid ultrasound 11/24/2013

CLINICAL DATA: 69-year-old female with thyroid goiter

EXAM:
THYROID ULTRASOUND
TECHNIQUE: Ultrasound examination of the thyroid gland and adjacent soft
tissues was performed.

[Series 1: us soft tissue head/neck · 0.06mm/px · 13 of 44 slices shown]
[im 1/44]
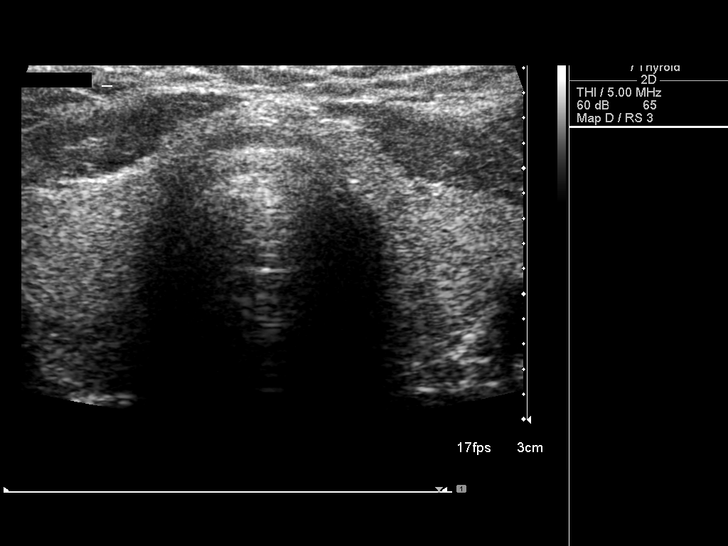
[im 4/44]
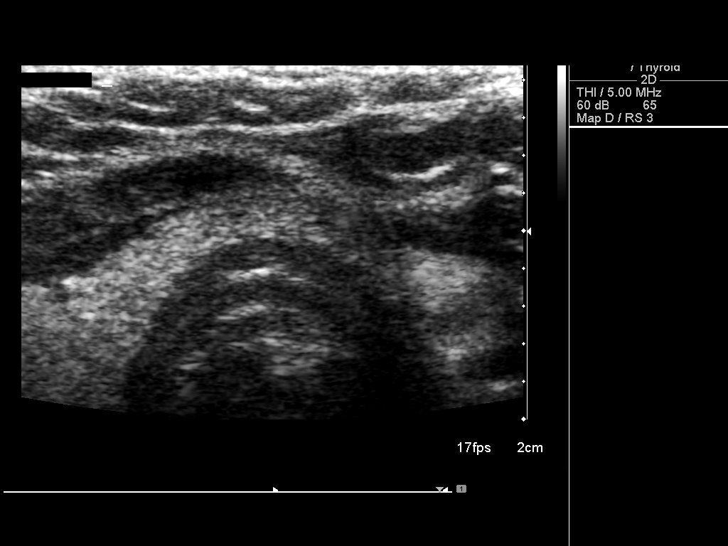
[im 8/44]
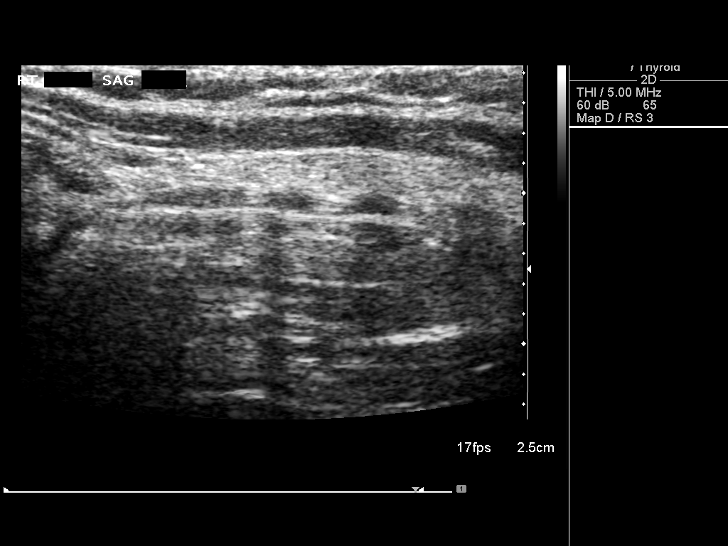
[im 11/44]
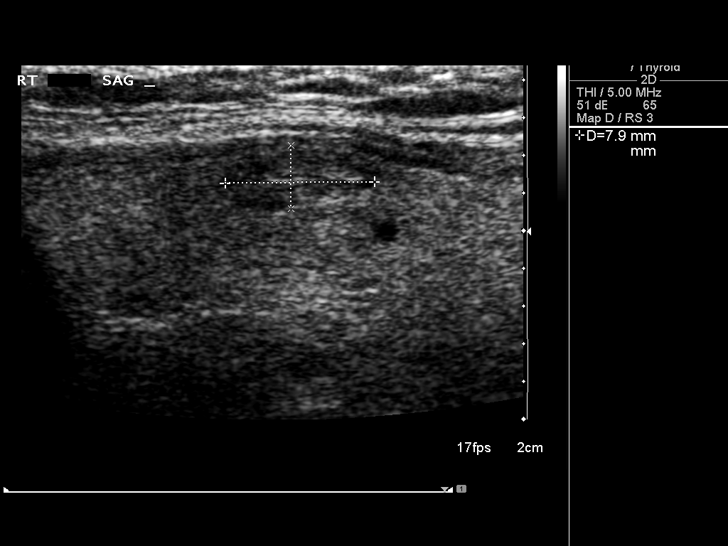
[im 15/44]
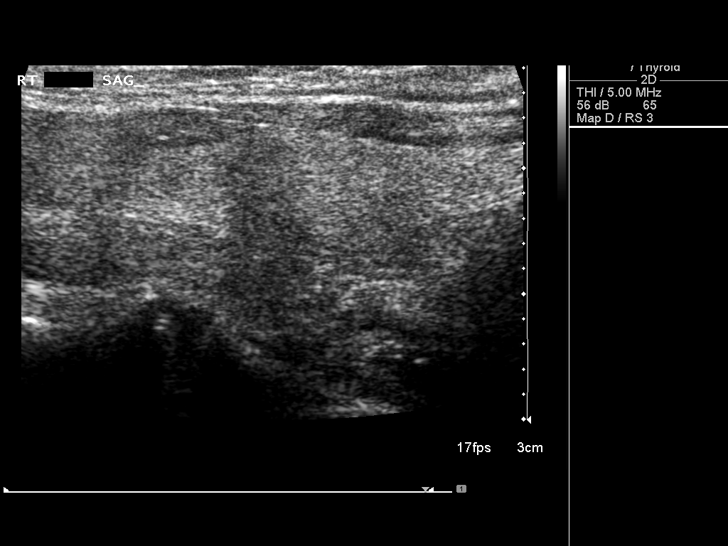
[im 18/44]
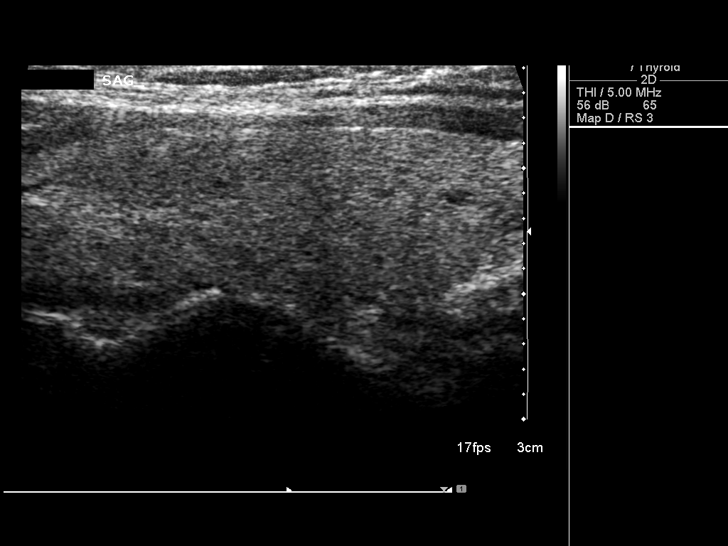
[im 22/44]
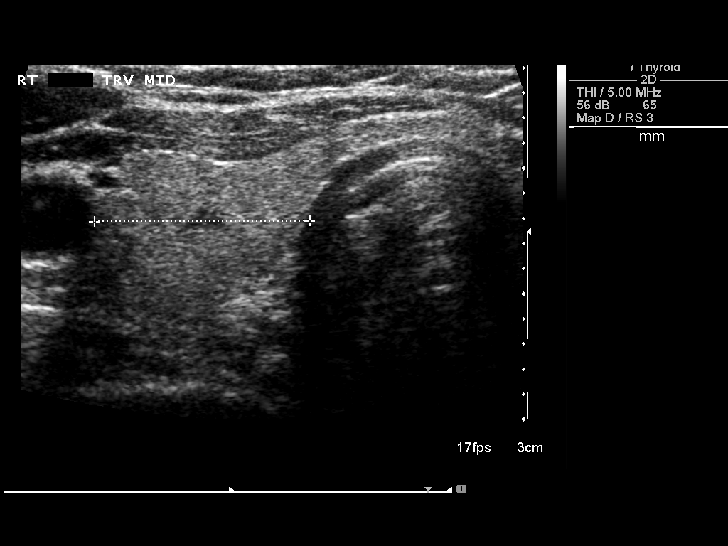
[im 26/44]
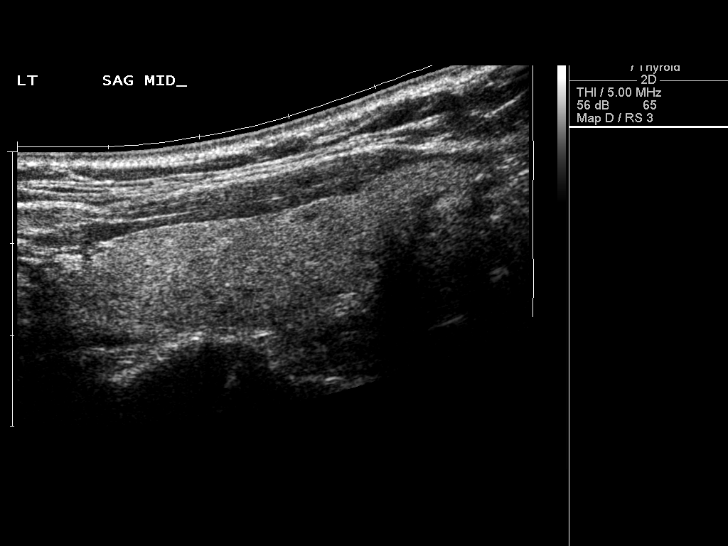
[im 29/44]
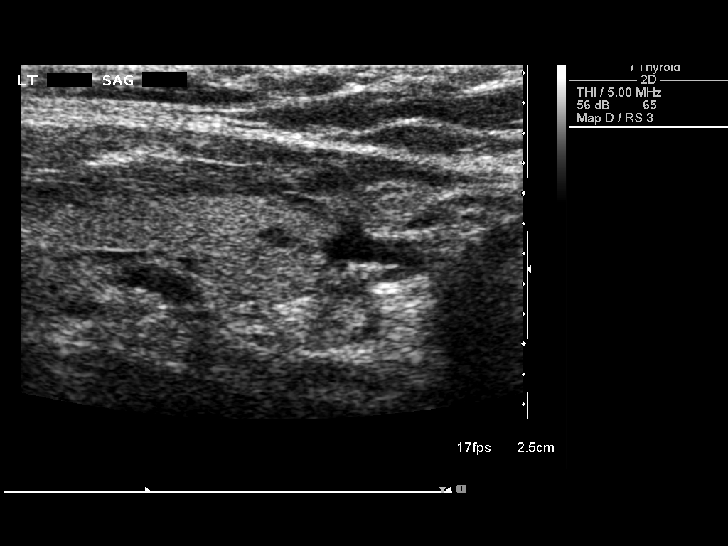
[im 33/44]
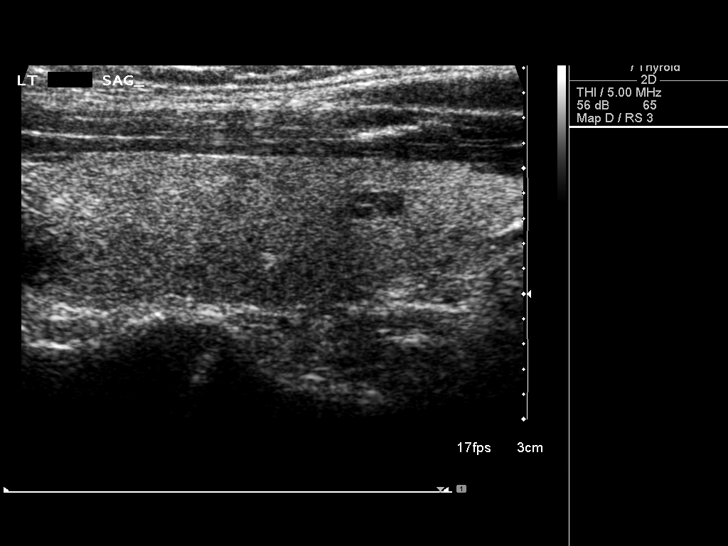
[im 36/44]
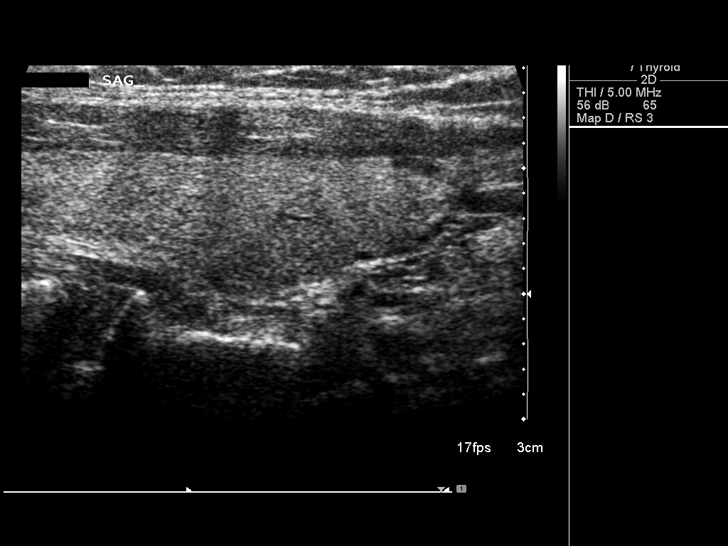
[im 40/44]
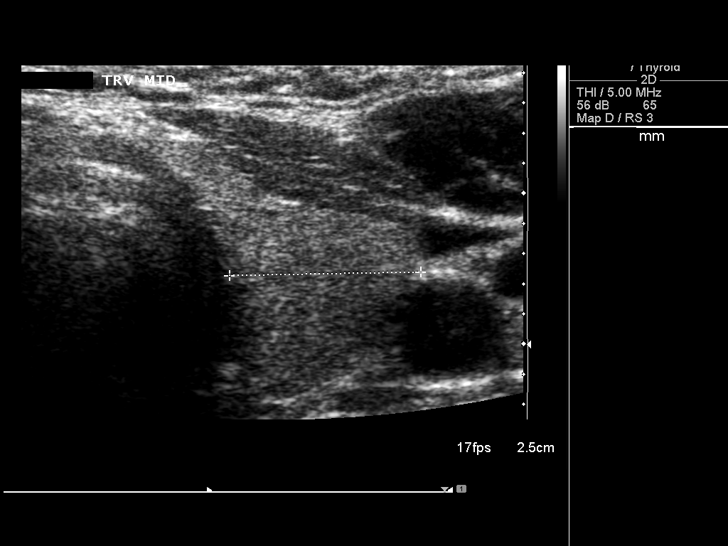
[im 44/44]
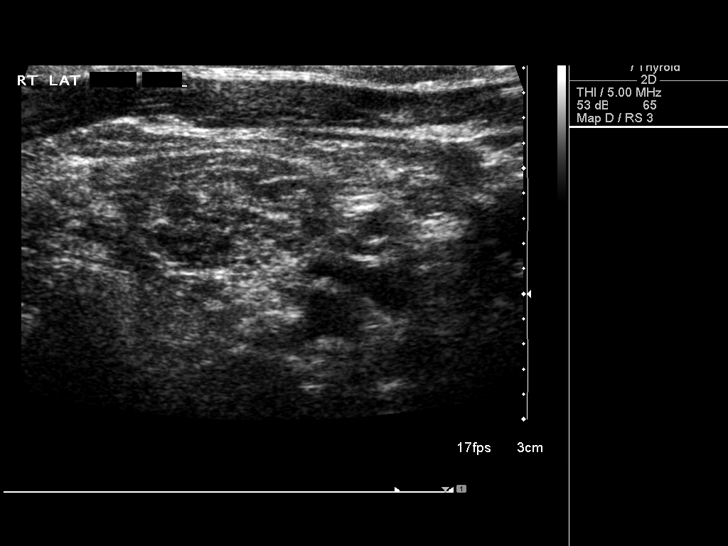

[13 of 25 positions shown; findings below may reference images not displayed]

FINDINGS: Right thyroid lobe

Measurements: 6.5 x 1.7 x 1.7 cm. Small hypoechoic nodule in the
right upper gland measures 0.8 x 0.3 x 0.5 cm. This nodule was not
present on the prior study.

Left thyroid lobe

Measurements: 4.8 x 1.4 x 1.3 cm. Stable hypoechoic 4 mm nodule in
the lower pole of the left gland. No interval change.

Isthmus

Thickness: 0.3 cm.  No nodules visualized.

Lymphadenopathy

None visualized.
IMPRESSION: 1. Stable 4 mm nodule in the lower pole of the left thyroid.
2. Hypoechoic solid 8 mm nodule in the upper pole of the right
thyroid gland was not definitively seen on the prior study.
Findings do not meet current SRU consensus criteria for biopsy.
Follow-up by clinical exam is recommended. If patient has known risk
factors for thyroid carcinoma, consider follow-up ultrasound in 12
months. If patient is clinically hyperthyroid, consider nuclear
medicine thyroid uptake and scan.Reference: Management of Thyroid
Nodules Detected at US: Society of Radiologists in Ultrasound

## 2016-10-10 DIAGNOSIS — M1711 Unilateral primary osteoarthritis, right knee: Secondary | ICD-10-CM | POA: Diagnosis not present

## 2016-10-10 DIAGNOSIS — M1712 Unilateral primary osteoarthritis, left knee: Secondary | ICD-10-CM | POA: Diagnosis not present

## 2016-10-17 DIAGNOSIS — G8929 Other chronic pain: Secondary | ICD-10-CM | POA: Diagnosis not present

## 2016-10-17 DIAGNOSIS — K7581 Nonalcoholic steatohepatitis (NASH): Secondary | ICD-10-CM | POA: Diagnosis not present

## 2016-10-17 DIAGNOSIS — E559 Vitamin D deficiency, unspecified: Secondary | ICD-10-CM | POA: Diagnosis not present

## 2016-10-17 DIAGNOSIS — Z01818 Encounter for other preprocedural examination: Secondary | ICD-10-CM | POA: Diagnosis not present

## 2016-10-17 DIAGNOSIS — R748 Abnormal levels of other serum enzymes: Secondary | ICD-10-CM | POA: Diagnosis not present

## 2016-10-17 DIAGNOSIS — M25561 Pain in right knee: Secondary | ICD-10-CM | POA: Diagnosis not present

## 2016-10-17 DIAGNOSIS — M8580 Other specified disorders of bone density and structure, unspecified site: Secondary | ICD-10-CM | POA: Diagnosis not present

## 2016-10-17 DIAGNOSIS — E041 Nontoxic single thyroid nodule: Secondary | ICD-10-CM | POA: Diagnosis not present

## 2016-10-17 DIAGNOSIS — F322 Major depressive disorder, single episode, severe without psychotic features: Secondary | ICD-10-CM | POA: Diagnosis not present

## 2016-10-18 ENCOUNTER — Other Ambulatory Visit: Payer: Self-pay | Admitting: Orthopedic Surgery

## 2016-11-10 ENCOUNTER — Other Ambulatory Visit: Payer: Self-pay | Admitting: Orthopedic Surgery

## 2016-11-21 ENCOUNTER — Other Ambulatory Visit (HOSPITAL_COMMUNITY): Payer: Self-pay | Admitting: Emergency Medicine

## 2016-11-21 ENCOUNTER — Encounter (HOSPITAL_COMMUNITY): Payer: Self-pay

## 2016-11-21 NOTE — Patient Instructions (Addendum)
Kayla Waters  11/21/2016   Your procedure is scheduled on: 11-24-16  Report to [facility] Main  Entrance Take [place]  elevators to 3rd floor to  [place] at [phone].    Call this number if you have problems the morning of surgery [phone]    Remember: ONLY 1 PERSON MAY GO WITH YOU TO SHORT STAY TO GET  READY MORNING OF YOUR SURGERY.  Do not eat food or drink liquids :After Midnight.     Take these medicines the morning of surgery with A SIP OF WATER: NONE                                 You may not have any metal on your body including hair pins and              piercings  Do not wear jewelry, make-up, lotions, powders or perfumes, deodorant             Do not wear nail polish.  Do not shave  48 hours prior to surgery.      Do not bring valuables to the hospital. [place].  Contacts, dentures or bridgework may not be worn into surgery.  Leave suitcase in the car. After surgery it may be brought to your room.               Please read over the following fact sheets you were given: _____________________________________________________________________           [facility] - Preparing for Surgery Before surgery, you can play an important role.  Because skin is not sterile, your skin needs to be as free of germs as possible.  You can reduce the number of germs on your skin by washing with CHG (chlorahexidine gluconate) soap before surgery.  CHG is an antiseptic cleaner which kills germs and bonds with the skin to continue killing germs even after washing. Please DO NOT use if you have an allergy to CHG or antibacterial soaps.  If your skin becomes reddened/irritated stop using the CHG and inform your nurse when you arrive at Short Stay. Do not shave (including legs and underarms) for at least 48 hours prior to the first CHG shower.  You may shave your face/neck. Please follow these instructions  carefully:  1.  Shower with CHG Soap the night before surgery and the  morning of Surgery.  2.  If you choose to wash your hair, wash your hair first as usual with your  normal  shampoo.  3.  After you shampoo, rinse your hair and body thoroughly to remove the  shampoo.                           4.  Use CHG as you would any other liquid soap.  You can apply chg directly  to the skin and wash                       Gently with a scrungie or clean washcloth.  5.  Apply the CHG Soap to your body ONLY FROM THE NECK DOWN.   Do not use on face/ open  Wound or open sores. Avoid contact with eyes, ears mouth and genitals (private parts).                       Wash face,  Genitals (private parts) with your normal soap.             6.  Wash thoroughly, paying special attention to the area where your surgery  will be performed.  7.  Thoroughly rinse your body with warm water from the neck down.  8.  DO NOT shower/wash with your normal soap after using and rinsing off  the CHG Soap.                9.  Pat yourself dry with a clean towel.            10.  Wear clean pajamas.            11.  Place clean sheets on your bed the night of your first shower and do not  sleep with pets. Day of Surgery : Do not apply any lotions/deodorants the morning of surgery.  Please wear clean clothes to the hospital/surgery center.  FAILURE TO FOLLOW THESE INSTRUCTIONS MAY RESULT IN THE CANCELLATION OF YOUR SURGERY PATIENT SIGNATURE_________________________________  NURSE SIGNATURE__________________________________  ________________________________________________________________________   Kayla Waters  An incentive spirometer is a tool that can help keep your lungs clear and active. This tool measures how well you are filling your lungs with each breath. Taking long deep breaths may help reverse or decrease the chance of developing breathing (pulmonary) problems (especially infection)  following:  A long period of time when you are unable to move or be active. BEFORE THE PROCEDURE   If the spirometer includes an indicator to show your best effort, your nurse or respiratory therapist will set it to a desired goal.  If possible, sit up straight or lean slightly forward. Try not to slouch.  Hold the incentive spirometer in an upright position. INSTRUCTIONS FOR USE  1. Sit on the edge of your bed if possible, or sit up as far as you can in bed or on a chair. 2. Hold the incentive spirometer in an upright position. 3. Breathe out normally. 4. Place the mouthpiece in your mouth and seal your lips tightly around it. 5. Breathe in slowly and as deeply as possible, raising the piston or the ball toward the top of the column. 6. Hold your breath for 3-5 seconds or for as long as possible. Allow the piston or ball to fall to the bottom of the column. 7. Remove the mouthpiece from your mouth and breathe out normally. 8. Rest for a few seconds and repeat Steps 1 through 7 at least 10 times every 1-2 hours when you are awake. Take your time and take a few normal breaths between deep breaths. 9. The spirometer may include an indicator to show your best effort. Use the indicator as a goal to work toward during each repetition. 10. After each set of 10 deep breaths, practice coughing to be sure your lungs are clear. If you have an incision (the cut made at the time of surgery), support your incision when coughing by placing a pillow or rolled up towels firmly against it. Once you are able to get out of bed, walk around indoors and cough well. You may stop using the incentive spirometer when instructed by your caregiver.  RISKS AND COMPLICATIONS  Take your time so you do not get  dizzy or light-headed.  If you are in pain, you may need to take or ask for pain medication before doing incentive spirometry. It is harder to take a deep breath if you are having pain. AFTER USE  Rest and  breathe slowly and easily.  It can be helpful to keep track of a log of your progress. Your caregiver can provide you with a simple table to help with this. If you are using the spirometer at home, follow these instructions: [place] IF:   You are having difficultly using the spirometer.  You have trouble using the spirometer as often as instructed.  Your pain medication is not giving enough relief while using the spirometer.  You develop fever of 100.5 F (38.1 C) or higher. SEEK IMMEDIATE MEDICAL CARE IF:   You cough up bloody sputum that had not been present before.  You develop fever of 102 F (38.9 C) or greater.  You develop worsening pain at or near the incision site. MAKE SURE YOU:   Understand these instructions.  Will watch your condition.  Will get help right away if you are not doing well or get worse. Document Released: 06/12/2006 Document Revised: 04/24/2011 Document Reviewed: 08/13/2006 [facility] Patient Information 2014 [place], [place].   ________________________________________________________________________

## 2016-11-22 ENCOUNTER — Encounter (HOSPITAL_COMMUNITY)
Admission: RE | Admit: 2016-11-22 | Discharge: 2016-11-22 | Disposition: A | Discharge status: Home / Self Care | Payer: Medicare Other | Source: Ambulatory Visit | Attending: Orthopedic Surgery | Admitting: Orthopedic Surgery

## 2016-11-22 ENCOUNTER — Encounter (HOSPITAL_COMMUNITY): Payer: Self-pay

## 2016-11-22 DIAGNOSIS — Z882 Allergy status to sulfonamides status: Secondary | ICD-10-CM | POA: Diagnosis not present

## 2016-11-22 DIAGNOSIS — Z880 Allergy status to penicillin: Secondary | ICD-10-CM | POA: Diagnosis not present

## 2016-11-22 DIAGNOSIS — Z79899 Other long term (current) drug therapy: Secondary | ICD-10-CM | POA: Diagnosis not present

## 2016-11-22 DIAGNOSIS — Z01812 Encounter for preprocedural laboratory examination: Secondary | ICD-10-CM | POA: Diagnosis not present

## 2016-11-22 DIAGNOSIS — F329 Major depressive disorder, single episode, unspecified: Secondary | ICD-10-CM | POA: Diagnosis not present

## 2016-11-22 DIAGNOSIS — Z885 Allergy status to narcotic agent status: Secondary | ICD-10-CM | POA: Diagnosis not present

## 2016-11-22 DIAGNOSIS — Z881 Allergy status to other antibiotic agents status: Secondary | ICD-10-CM | POA: Diagnosis not present

## 2016-11-22 DIAGNOSIS — E785 Hyperlipidemia, unspecified: Secondary | ICD-10-CM | POA: Diagnosis not present

## 2016-11-22 DIAGNOSIS — Z96652 Presence of left artificial knee joint: Secondary | ICD-10-CM | POA: Diagnosis not present

## 2016-11-22 DIAGNOSIS — Z886 Allergy status to analgesic agent status: Secondary | ICD-10-CM | POA: Diagnosis not present

## 2016-11-22 DIAGNOSIS — M8580 Other specified disorders of bone density and structure, unspecified site: Secondary | ICD-10-CM | POA: Diagnosis not present

## 2016-11-22 DIAGNOSIS — M1711 Unilateral primary osteoarthritis, right knee: Secondary | ICD-10-CM | POA: Diagnosis not present

## 2016-11-22 DIAGNOSIS — Z803 Family history of malignant neoplasm of breast: Secondary | ICD-10-CM | POA: Diagnosis not present

## 2016-11-22 DIAGNOSIS — Z01810 Encounter for preprocedural cardiovascular examination: Secondary | ICD-10-CM | POA: Diagnosis not present

## 2016-11-22 HISTORY — DX: Nausea with vomiting, unspecified: R11.2

## 2016-11-22 HISTORY — DX: Other specified abnormal findings of blood chemistry: R79.89

## 2016-11-22 HISTORY — DX: Unspecified fracture of unspecified forearm, initial encounter for closed fracture: S52.90XA

## 2016-11-22 HISTORY — DX: Abnormal results of liver function studies: R94.5

## 2016-11-22 HISTORY — DX: Depression, unspecified: F32.A

## 2016-11-22 HISTORY — DX: Hyperlipidemia, unspecified: E78.5

## 2016-11-22 HISTORY — DX: Major depressive disorder, single episode, unspecified: F32.9

## 2016-11-22 HISTORY — DX: Other specified disorders of bone density and structure, unspecified site: M85.80

## 2016-11-22 HISTORY — DX: Unspecified osteoarthritis, unspecified site: M19.90

## 2016-11-22 HISTORY — DX: Other specified postprocedural states: Z98.890

## 2016-11-22 LAB — COMPREHENSIVE METABOLIC PANEL
ALK PHOS: 109 U/L (ref 38–126)
ALT: 83 U/L — AB (ref 14–54)
AST: 56 U/L — ABNORMAL HIGH (ref 15–41)
Albumin: 4.4 g/dL (ref 3.5–5.0)
Anion gap: 10 (ref 5–15)
BILIRUBIN TOTAL: 0.4 mg/dL (ref 0.3–1.2)
BUN: 12 mg/dL (ref 6–20)
CALCIUM: 9.7 mg/dL (ref 8.9–10.3)
CO2: 25 mmol/L (ref 22–32)
CREATININE: 0.81 mg/dL (ref 0.44–1.00)
Chloride: 103 mmol/L (ref 101–111)
Glucose, Bld: 95 mg/dL (ref 65–99)
Potassium: 4.5 mmol/L (ref 3.5–5.1)
SODIUM: 138 mmol/L (ref 135–145)
Total Protein: 7.1 g/dL (ref 6.5–8.1)

## 2016-11-22 LAB — CBC WITH DIFFERENTIAL/PLATELET
Basophils Absolute: 0.1 10*3/uL (ref 0.0–0.1)
Basophils Relative: 1 %
Eosinophils Absolute: 0.1 10*3/uL (ref 0.0–0.7)
Eosinophils Relative: 2 %
HEMATOCRIT: 43.4 % (ref 36.0–46.0)
HEMOGLOBIN: 14.6 g/dL (ref 12.0–15.0)
LYMPHS ABS: 1.8 10*3/uL (ref 0.7–4.0)
LYMPHS PCT: 28 %
MCH: 29.9 pg (ref 26.0–34.0)
MCHC: 33.6 g/dL (ref 30.0–36.0)
MCV: 88.8 fL (ref 78.0–100.0)
Monocytes Absolute: 0.6 10*3/uL (ref 0.1–1.0)
Monocytes Relative: 10 %
NEUTROS ABS: 3.7 10*3/uL (ref 1.7–7.7)
NEUTROS PCT: 59 %
Platelets: 384 10*3/uL (ref 150–400)
RBC: 4.89 MIL/uL (ref 3.87–5.11)
RDW: 14 % (ref 11.5–15.5)
WBC: 6.2 10*3/uL (ref 4.0–10.5)

## 2016-11-22 LAB — APTT: aPTT: 28 seconds (ref 24–36)

## 2016-11-22 LAB — URINALYSIS, ROUTINE W REFLEX MICROSCOPIC
BACTERIA UA: NONE SEEN
BILIRUBIN URINE: NEGATIVE
Glucose, UA: NEGATIVE mg/dL
HGB URINE DIPSTICK: NEGATIVE
Ketones, ur: NEGATIVE mg/dL
NITRITE: NEGATIVE
Protein, ur: NEGATIVE mg/dL
Specific Gravity, Urine: 1.009 (ref 1.005–1.030)
pH: 6 (ref 5.0–8.0)

## 2016-11-22 LAB — SURGICAL PCR SCREEN
MRSA, PCR: NEGATIVE
Staphylococcus aureus: NEGATIVE

## 2016-11-22 LAB — PROTIME-INR
INR: 0.87
Prothrombin Time: 11.8 seconds (ref 11.4–15.2)

## 2016-11-22 NOTE — Progress Notes (Signed)
UA routed via epic to Dr Dorna Leitz

## 2016-11-23 LAB — ABO/RH: ABO/RH(D): O POS

## 2016-11-23 NOTE — Anesthesia Preprocedure Evaluation (Addendum)
Anesthesia Evaluation  Patient identified by MRN, date of birth, ID band Patient awake    Reviewed: Allergy & Precautions, H&P , NPO status , Patient's Chart, lab work & pertinent test results  History of Anesthesia Complications (+) PONV  Airway Mallampati: I  TM Distance: >3 FB Neck ROM: Full    Dental no notable dental hx. (+) Teeth Intact, Dental Advisory Given   Pulmonary neg pulmonary ROS,    Pulmonary exam normal breath sounds clear to auscultation       Cardiovascular Exercise Tolerance: Good negative cardio ROS   Rhythm:Regular Rate:Normal     Neuro/Psych Depression negative neurological ROS  negative psych ROS   GI/Hepatic negative GI ROS, Neg liver ROS,   Endo/Other  negative endocrine ROS  Renal/GU negative Renal ROS  negative genitourinary   Musculoskeletal  (+) Arthritis , Osteoarthritis,    Abdominal   Peds  Hematology negative hematology ROS (+)   Anesthesia Other Findings   Reproductive/Obstetrics negative OB ROS                            Anesthesia Physical Anesthesia Plan  ASA: II  Anesthesia Plan: Spinal   Post-op Pain Management:  Regional for Post-op pain   Induction: Intravenous  PONV Risk Score and Plan: 4 or greater and Ondansetron, Dexamethasone, Midazolam and Propofol infusion  Airway Management Planned: Simple Face Mask  Additional Equipment:   Intra-op Plan:   Post-operative Plan:   Informed Consent: I have reviewed the patients History and Physical, chart, labs and discussed the procedure including the risks, benefits and alternatives for the proposed anesthesia with the patient or authorized representative who has indicated his/her understanding and acceptance.   Dental advisory given  Plan Discussed with: CRNA  Anesthesia Plan Comments:        Anesthesia Quick Evaluation

## 2016-11-24 ENCOUNTER — Inpatient Hospital Stay (HOSPITAL_COMMUNITY): Payer: Medicare Other | Admitting: Anesthesiology

## 2016-11-24 ENCOUNTER — Observation Stay (HOSPITAL_COMMUNITY)
Admission: RE | Admit: 2016-11-24 | Discharge: 2016-11-25 | Disposition: A | Discharge status: Home / Self Care | Payer: Medicare Other | Source: Ambulatory Visit | Attending: Orthopedic Surgery | Admitting: Orthopedic Surgery

## 2016-11-24 ENCOUNTER — Encounter (HOSPITAL_COMMUNITY): Payer: Self-pay | Admitting: *Deleted

## 2016-11-24 ENCOUNTER — Encounter (HOSPITAL_COMMUNITY)
Admission: RE | Disposition: A | Discharge status: Home / Self Care | Payer: Self-pay | Source: Ambulatory Visit | Attending: Orthopedic Surgery

## 2016-11-24 DIAGNOSIS — Z01810 Encounter for preprocedural cardiovascular examination: Secondary | ICD-10-CM | POA: Insufficient documentation

## 2016-11-24 DIAGNOSIS — F329 Major depressive disorder, single episode, unspecified: Secondary | ICD-10-CM | POA: Diagnosis not present

## 2016-11-24 DIAGNOSIS — E785 Hyperlipidemia, unspecified: Secondary | ICD-10-CM | POA: Insufficient documentation

## 2016-11-24 DIAGNOSIS — Z880 Allergy status to penicillin: Secondary | ICD-10-CM | POA: Insufficient documentation

## 2016-11-24 DIAGNOSIS — Z803 Family history of malignant neoplasm of breast: Secondary | ICD-10-CM | POA: Insufficient documentation

## 2016-11-24 DIAGNOSIS — Z96652 Presence of left artificial knee joint: Secondary | ICD-10-CM | POA: Insufficient documentation

## 2016-11-24 DIAGNOSIS — Z01812 Encounter for preprocedural laboratory examination: Secondary | ICD-10-CM | POA: Diagnosis not present

## 2016-11-24 DIAGNOSIS — Z881 Allergy status to other antibiotic agents status: Secondary | ICD-10-CM | POA: Diagnosis not present

## 2016-11-24 DIAGNOSIS — Z885 Allergy status to narcotic agent status: Secondary | ICD-10-CM | POA: Diagnosis not present

## 2016-11-24 DIAGNOSIS — Z882 Allergy status to sulfonamides status: Secondary | ICD-10-CM | POA: Insufficient documentation

## 2016-11-24 DIAGNOSIS — Z886 Allergy status to analgesic agent status: Secondary | ICD-10-CM | POA: Insufficient documentation

## 2016-11-24 DIAGNOSIS — Z79899 Other long term (current) drug therapy: Secondary | ICD-10-CM | POA: Diagnosis not present

## 2016-11-24 DIAGNOSIS — M8580 Other specified disorders of bone density and structure, unspecified site: Secondary | ICD-10-CM | POA: Diagnosis not present

## 2016-11-24 DIAGNOSIS — M1711 Unilateral primary osteoarthritis, right knee: Secondary | ICD-10-CM | POA: Diagnosis present

## 2016-11-24 DIAGNOSIS — G8918 Other acute postprocedural pain: Secondary | ICD-10-CM | POA: Diagnosis not present

## 2016-11-24 HISTORY — PX: TOTAL KNEE ARTHROPLASTY: SHX125

## 2016-11-24 LAB — TYPE AND SCREEN
ABO/RH(D): O POS
ANTIBODY SCREEN: NEGATIVE

## 2016-11-24 SURGERY — ARTHROPLASTY, KNEE, TOTAL
Anesthesia: Spinal | Site: Knee | Laterality: Right

## 2016-11-24 MED ORDER — HYDROMORPHONE HCL-NACL 0.5-0.9 MG/ML-% IV SOSY
0.2500 mg | PREFILLED_SYRINGE | INTRAVENOUS | Status: DC | PRN
  Administered 2016-11-24 (×2): 0.5 mg via INTRAVENOUS

## 2016-11-24 MED ORDER — DEXAMETHASONE SODIUM PHOSPHATE 10 MG/ML IJ SOLN
INTRAMUSCULAR | Status: DC | PRN
  Administered 2016-11-24: 10 mg via INTRAVENOUS

## 2016-11-24 MED ORDER — OXYCODONE-ACETAMINOPHEN 5-325 MG PO TABS: 1.0000 | ORAL_TABLET | Freq: Four times a day (QID) | ORAL | 0 refills | Status: AC | PRN

## 2016-11-24 MED ORDER — CEFAZOLIN SODIUM-DEXTROSE 2-4 GM/100ML-% IV SOLN
2.0000 g | Freq: Four times a day (QID) | INTRAVENOUS | Status: AC
  Administered 2016-11-24 (×2): 2 g via INTRAVENOUS
  Filled 2016-11-24 (×2): qty 100

## 2016-11-24 MED ORDER — ASPIRIN 325 MG PO TABS
325.0000 mg | ORAL_TABLET | Freq: Two times a day (BID) | ORAL | Status: DC
  Administered 2016-11-25: 08:00:00 325 mg via ORAL
  Filled 2016-11-24: qty 1

## 2016-11-24 MED ORDER — BUPIVACAINE LIPOSOME 1.3 % IJ SUSP
20.0000 mL | Freq: Once | INTRAMUSCULAR | Status: DC
  Filled 2016-11-24: qty 20

## 2016-11-24 MED ORDER — HYDROMORPHONE HCL-NACL 0.5-0.9 MG/ML-% IV SOSY
0.5000 mg | PREFILLED_SYRINGE | INTRAVENOUS | Status: DC | PRN
  Administered 2016-11-24 (×2): 0.5 mg via INTRAVENOUS
  Administered 2016-11-24: 1 mg via INTRAVENOUS
  Filled 2016-11-24 (×2): qty 2

## 2016-11-24 MED ORDER — DEXAMETHASONE SODIUM PHOSPHATE 10 MG/ML IJ SOLN
10.0000 mg | Freq: Two times a day (BID) | INTRAMUSCULAR | Status: DC
  Administered 2016-11-24 – 2016-11-25 (×2): 10 mg via INTRAVENOUS
  Filled 2016-11-24 (×2): qty 1

## 2016-11-24 MED ORDER — TIZANIDINE HCL 2 MG PO TABS: 2.0000 mg | ORAL_TABLET | Freq: Three times a day (TID) | ORAL | 0 refills | Status: AC | PRN

## 2016-11-24 MED ORDER — ZOLPIDEM TARTRATE 5 MG PO TABS: 5.0000 mg | ORAL_TABLET | Freq: Every evening | ORAL | Status: DC | PRN

## 2016-11-24 MED ORDER — STERILE WATER FOR IRRIGATION IR SOLN
Status: DC | PRN
  Administered 2016-11-24: 2000 mL

## 2016-11-24 MED ORDER — ONDANSETRON HCL 4 MG/2ML IJ SOLN
INTRAMUSCULAR | Status: DC | PRN
  Administered 2016-11-24: 4 mg via INTRAVENOUS

## 2016-11-24 MED ORDER — ONDANSETRON HCL 4 MG/2ML IJ SOLN
4.0000 mg | Freq: Four times a day (QID) | INTRAMUSCULAR | Status: DC | PRN
  Administered 2016-11-24: 22:00:00 4 mg via INTRAVENOUS
  Filled 2016-11-24: qty 2

## 2016-11-24 MED ORDER — CHLORHEXIDINE GLUCONATE 4 % EX LIQD: 60.0000 mL | Freq: Once | CUTANEOUS | Status: DC

## 2016-11-24 MED ORDER — DIPHENHYDRAMINE HCL 12.5 MG/5ML PO ELIX
12.5000 mg | ORAL_SOLUTION | ORAL | Status: DC | PRN
  Filled 2016-11-24: qty 10

## 2016-11-24 MED ORDER — DOCUSATE SODIUM 100 MG PO CAPS
100.0000 mg | ORAL_CAPSULE | Freq: Two times a day (BID) | ORAL | Status: DC
  Administered 2016-11-24 – 2016-11-25 (×2): 100 mg via ORAL
  Filled 2016-11-24 (×2): qty 1

## 2016-11-24 MED ORDER — ALUM & MAG HYDROXIDE-SIMETH 200-200-20 MG/5ML PO SUSP
30.0000 mL | ORAL | Status: DC | PRN
  Filled 2016-11-24: qty 30

## 2016-11-24 MED ORDER — GABAPENTIN 300 MG PO CAPS
300.0000 mg | ORAL_CAPSULE | Freq: Two times a day (BID) | ORAL | Status: DC
  Administered 2016-11-24 – 2016-11-25 (×2): 300 mg via ORAL
  Filled 2016-11-24 (×2): qty 1

## 2016-11-24 MED ORDER — CEFAZOLIN SODIUM-DEXTROSE 2-4 GM/100ML-% IV SOLN
INTRAVENOUS | Status: AC
  Filled 2016-11-24: qty 100

## 2016-11-24 MED ORDER — PROPOFOL 10 MG/ML IV BOLUS
INTRAVENOUS | Status: AC
  Filled 2016-11-24: qty 20

## 2016-11-24 MED ORDER — METHOCARBAMOL 500 MG PO TABS
500.0000 mg | ORAL_TABLET | Freq: Four times a day (QID) | ORAL | Status: DC | PRN
  Administered 2016-11-25 (×2): 500 mg via ORAL
  Filled 2016-11-24 (×2): qty 1

## 2016-11-24 MED ORDER — BUPIVACAINE LIPOSOME 1.3 % IJ SUSP
INTRAMUSCULAR | Status: DC | PRN
  Administered 2016-11-24: 20 mL

## 2016-11-24 MED ORDER — LIDOCAINE 2% (20 MG/ML) 5 ML SYRINGE
INTRAMUSCULAR | Status: AC
  Filled 2016-11-24: qty 5

## 2016-11-24 MED ORDER — SODIUM CHLORIDE 0.9 % IR SOLN
Status: DC | PRN
  Administered 2016-11-24: 1000 mL

## 2016-11-24 MED ORDER — EPHEDRINE 5 MG/ML INJ
INTRAVENOUS | Status: AC
  Filled 2016-11-24: qty 10

## 2016-11-24 MED ORDER — FENTANYL CITRATE (PF) 100 MCG/2ML IJ SOLN
INTRAMUSCULAR | Status: AC
  Filled 2016-11-24: qty 2

## 2016-11-24 MED ORDER — CEFAZOLIN SODIUM-DEXTROSE 2-4 GM/100ML-% IV SOLN
2.0000 g | INTRAVENOUS | Status: AC
  Administered 2016-11-24: 2 g via INTRAVENOUS

## 2016-11-24 MED ORDER — BISACODYL 5 MG PO TBEC: 5.0000 mg | DELAYED_RELEASE_TABLET | Freq: Every day | ORAL | Status: DC | PRN

## 2016-11-24 MED ORDER — SODIUM CHLORIDE 0.9 % IJ SOLN
INTRAMUSCULAR | Status: AC
Start: 2016-11-24 — End: 2016-11-24
  Filled 2016-11-24: qty 20

## 2016-11-24 MED ORDER — APIXABAN 2.5 MG PO TABS: 2.5000 mg | ORAL_TABLET | Freq: Two times a day (BID) | ORAL | Status: DC

## 2016-11-24 MED ORDER — ACETAMINOPHEN 10 MG/ML IV SOLN
INTRAVENOUS | Status: AC
  Filled 2016-11-24: qty 100

## 2016-11-24 MED ORDER — ACETAMINOPHEN 10 MG/ML IV SOLN
INTRAVENOUS | Status: DC | PRN
  Administered 2016-11-24: 1000 mg via INTRAVENOUS

## 2016-11-24 MED ORDER — DOCUSATE SODIUM 100 MG PO CAPS: 100.0000 mg | ORAL_CAPSULE | Freq: Two times a day (BID) | ORAL | 0 refills | Status: AC

## 2016-11-24 MED ORDER — ONDANSETRON HCL 4 MG/2ML IJ SOLN
INTRAMUSCULAR | Status: AC
  Filled 2016-11-24: qty 2

## 2016-11-24 MED ORDER — PROPOFOL 500 MG/50ML IV EMUL
INTRAVENOUS | Status: DC | PRN
  Administered 2016-11-24: 150 ug/kg/min via INTRAVENOUS

## 2016-11-24 MED ORDER — LACTATED RINGERS IV SOLN: INTRAVENOUS | Status: DC

## 2016-11-24 MED ORDER — APIXABAN 2.5 MG PO TABS: 2.5000 mg | ORAL_TABLET | Freq: Two times a day (BID) | ORAL | 0 refills | Status: DC

## 2016-11-24 MED ORDER — POLYETHYLENE GLYCOL 3350 17 G PO PACK: 17.0000 g | PACK | Freq: Every day | ORAL | Status: DC | PRN

## 2016-11-24 MED ORDER — CEFUROXIME SODIUM 1.5 G IV SOLR
1.5000 g | INTRAVENOUS | Status: DC
  Filled 2016-11-24: qty 1.5

## 2016-11-24 MED ORDER — BUPIVACAINE-EPINEPHRINE (PF) 0.5% -1:200000 IJ SOLN
INTRAMUSCULAR | Status: AC
  Filled 2016-11-24: qty 60

## 2016-11-24 MED ORDER — DEXTROSE 5 % IV SOLN
500.0000 mg | Freq: Four times a day (QID) | INTRAVENOUS | Status: DC | PRN
  Administered 2016-11-24: 500 mg via INTRAVENOUS
  Filled 2016-11-24: qty 550

## 2016-11-24 MED ORDER — LACTATED RINGERS IV SOLN
INTRAVENOUS | Status: DC | PRN
  Administered 2016-11-24 (×3): via INTRAVENOUS

## 2016-11-24 MED ORDER — TRANEXAMIC ACID 1000 MG/10ML IV SOLN
1000.0000 mg | Freq: Once | INTRAVENOUS | Status: AC
  Administered 2016-11-24: 13:00:00 1000 mg via INTRAVENOUS
  Filled 2016-11-24: qty 1100

## 2016-11-24 MED ORDER — ACETAMINOPHEN 325 MG PO TABS: 650.0000 mg | ORAL_TABLET | Freq: Four times a day (QID) | ORAL | Status: DC | PRN

## 2016-11-24 MED ORDER — ACETAMINOPHEN 650 MG RE SUPP
650.0000 mg | Freq: Four times a day (QID) | RECTAL | Status: DC | PRN
  Filled 2016-11-24: qty 1

## 2016-11-24 MED ORDER — LIDOCAINE 2% (20 MG/ML) 5 ML SYRINGE
INTRAMUSCULAR | Status: DC | PRN
Start: 2016-11-24 — End: 2016-11-24
  Administered 2016-11-24: 50 mg via INTRAVENOUS

## 2016-11-24 MED ORDER — MAGNESIUM CITRATE PO SOLN: 1.0000 | Freq: Once | ORAL | Status: DC | PRN

## 2016-11-24 MED ORDER — DEXTROSE 5 % IV SOLN
1.5000 g | INTRAVENOUS | Status: AC
  Administered 2016-11-24: 1.5 g via INTRAVENOUS
  Filled 2016-11-24 (×2): qty 1.5

## 2016-11-24 MED ORDER — BUPIVACAINE IN DEXTROSE 0.75-8.25 % IT SOLN
INTRATHECAL | Status: DC | PRN
  Administered 2016-11-24: 2 mL via INTRATHECAL

## 2016-11-24 MED ORDER — FENTANYL CITRATE (PF) 100 MCG/2ML IJ SOLN
INTRAMUSCULAR | Status: DC | PRN
  Administered 2016-11-24 (×2): 50 ug via INTRAVENOUS

## 2016-11-24 MED ORDER — 0.9 % SODIUM CHLORIDE (POUR BTL) OPTIME
TOPICAL | Status: DC | PRN
  Administered 2016-11-24: 1000 mL

## 2016-11-24 MED ORDER — ONDANSETRON HCL 4 MG PO TABS
4.0000 mg | ORAL_TABLET | Freq: Four times a day (QID) | ORAL | Status: DC | PRN
  Filled 2016-11-24: qty 1

## 2016-11-24 MED ORDER — OXYCODONE HCL 5 MG PO TABS
5.0000 mg | ORAL_TABLET | ORAL | Status: DC | PRN
  Administered 2016-11-24 – 2016-11-25 (×6): 5 mg via ORAL
  Administered 2016-11-25: 10 mg via ORAL
  Administered 2016-11-25: 5 mg via ORAL
  Filled 2016-11-24 (×2): qty 2
  Filled 2016-11-24 (×8): qty 1

## 2016-11-24 MED ORDER — BUPIVACAINE-EPINEPHRINE 0.5% -1:200000 IJ SOLN
INTRAMUSCULAR | Status: DC | PRN
  Administered 2016-11-24: 30 mL

## 2016-11-24 MED ORDER — SODIUM CHLORIDE 0.9 % IV SOLN
INTRAVENOUS | Status: DC
  Administered 2016-11-24 (×2): via INTRAVENOUS

## 2016-11-24 MED ORDER — EPHEDRINE SULFATE-NACL 50-0.9 MG/10ML-% IV SOSY
PREFILLED_SYRINGE | INTRAVENOUS | Status: DC | PRN
  Administered 2016-11-24 (×5): 10 mg via INTRAVENOUS

## 2016-11-24 MED ORDER — ROPIVACAINE HCL 7.5 MG/ML IJ SOLN
INTRAMUSCULAR | Status: DC | PRN
  Administered 2016-11-24: 20 mL via PERINEURAL

## 2016-11-24 MED ORDER — HYDROMORPHONE HCL-NACL 0.5-0.9 MG/ML-% IV SOSY
PREFILLED_SYRINGE | INTRAVENOUS | Status: AC
  Filled 2016-11-24: qty 2

## 2016-11-24 MED ORDER — VILAZODONE HCL 10 MG PO TABS
10.0000 mg | ORAL_TABLET | ORAL | Status: DC
  Administered 2016-11-24: 17:00:00 10 mg via ORAL
  Filled 2016-11-24 (×2): qty 1

## 2016-11-24 MED ORDER — MIDAZOLAM HCL 5 MG/5ML IJ SOLN
INTRAMUSCULAR | Status: DC | PRN
  Administered 2016-11-24: 2 mg via INTRAVENOUS

## 2016-11-24 MED ORDER — TRANEXAMIC ACID 1000 MG/10ML IV SOLN
1000.0000 mg | INTRAVENOUS | Status: AC
  Administered 2016-11-24: 1000 mg via INTRAVENOUS
  Filled 2016-11-24: qty 10

## 2016-11-24 MED ORDER — DEXAMETHASONE SODIUM PHOSPHATE 10 MG/ML IJ SOLN
INTRAMUSCULAR | Status: AC
Start: 2016-11-24 — End: 2016-11-24
  Filled 2016-11-24: qty 1

## 2016-11-24 MED ORDER — MIDAZOLAM HCL 2 MG/2ML IJ SOLN
INTRAMUSCULAR | Status: AC
  Filled 2016-11-24: qty 2

## 2016-11-24 MED ORDER — SODIUM CHLORIDE 0.9 % IJ SOLN
INTRAMUSCULAR | Status: DC | PRN
  Administered 2016-11-24: 20 mL

## 2016-11-24 SURGICAL SUPPLY — 58 items
APL SKNCLS STERI-STRIP NONHPOA (GAUZE/BANDAGES/DRESSINGS) ×1
BAG SPEC THK2 15X12 ZIP CLS (MISCELLANEOUS) ×1
BAG ZIPLOCK 12X15 (MISCELLANEOUS) ×3 IMPLANT
BANDAGE ACE 4X5 VEL STRL LF (GAUZE/BANDAGES/DRESSINGS) ×3 IMPLANT
BANDAGE ACE 6X5 VEL STRL LF (GAUZE/BANDAGES/DRESSINGS) ×3 IMPLANT
BENZOIN TINCTURE PRP APPL 2/3 (GAUZE/BANDAGES/DRESSINGS) ×3 IMPLANT
BLADE SAG 18X100X1.27 (BLADE) ×3 IMPLANT
BLADE SAW SGTL 13.0X1.19X90.0M (BLADE) ×3 IMPLANT
BOOTIES KNEE HIGH SLOAN (MISCELLANEOUS) ×3 IMPLANT
BOWL SMART MIX CTS (DISPOSABLE) ×3 IMPLANT
CAPT KNEE TOTAL 3 ATTUNE ×3 IMPLANT
CEMENT HV SMART SET (Cement) ×6 IMPLANT
CLOSURE WOUND 1/2 X4 (GAUZE/BANDAGES/DRESSINGS) ×1
CUFF TOURN SGL QUICK 34 (TOURNIQUET CUFF) ×2
CUFF TRNQT CYL 34X4X40X1 (TOURNIQUET CUFF) ×1 IMPLANT
DRAPE U-SHAPE 47X51 STRL (DRAPES) ×3 IMPLANT
DRSG ADAPTIC 3X8 NADH LF (GAUZE/BANDAGES/DRESSINGS) ×3 IMPLANT
DRSG AQUACEL AG ADV 3.5X10 (GAUZE/BANDAGES/DRESSINGS) ×3 IMPLANT
DRSG MEPILEX BORDER 4X8 (GAUZE/BANDAGES/DRESSINGS) ×3 IMPLANT
DRSG PAD ABDOMINAL 8X10 ST (GAUZE/BANDAGES/DRESSINGS) ×3 IMPLANT
DURAPREP 26ML APPLICATOR (WOUND CARE) ×3 IMPLANT
ELECT REM PT RETURN 15FT ADLT (MISCELLANEOUS) ×3 IMPLANT
GAUZE SPONGE 4X4 12PLY STRL (GAUZE/BANDAGES/DRESSINGS) ×3 IMPLANT
GLOVE BIO SURGEON STRL SZ8 (GLOVE) IMPLANT
GLOVE BIOGEL PI IND STRL 8 (GLOVE) ×2 IMPLANT
GLOVE BIOGEL PI INDICATOR 8 (GLOVE) ×4
GLOVE ECLIPSE 7.5 STRL STRAW (GLOVE) ×6 IMPLANT
GLOVE ORTHO TXT STRL SZ7.5 (GLOVE) IMPLANT
GOWN SPEC L4 XLG W/TWL (GOWN DISPOSABLE) ×3 IMPLANT
HANDPIECE INTERPULSE COAX TIP (DISPOSABLE) ×2
HOOD PEEL AWAY FLYTE STAYCOOL (MISCELLANEOUS) ×6 IMPLANT
IMMOBILIZER KNEE 20 (SOFTGOODS) ×6 IMPLANT
IMMOBILIZER KNEE 20 THIGH 36 (SOFTGOODS) ×1 IMPLANT
MANIFOLD NEPTUNE II (INSTRUMENTS) ×3 IMPLANT
NDL SAFETY ECLIPSE 18X1.5 (NEEDLE) ×1 IMPLANT
NEEDLE HYPO 18GX1.5 SHARP (NEEDLE) ×2
NS IRRIG 1000ML POUR BTL (IV SOLUTION) ×3 IMPLANT
PACK ICE MAXI GEL EZY WRAP (MISCELLANEOUS) ×3 IMPLANT
PACK TOTAL KNEE CUSTOM (KITS) ×3 IMPLANT
PADDING CAST COTTON 6X4 STRL (CAST SUPPLIES) ×3 IMPLANT
POSITIONER SURGICAL ARM (MISCELLANEOUS) ×3 IMPLANT
SET HNDPC FAN SPRY TIP SCT (DISPOSABLE) ×1 IMPLANT
STAPLER VISISTAT 35W (STAPLE) IMPLANT
STRIP CLOSURE SKIN 1/2X4 (GAUZE/BANDAGES/DRESSINGS) ×2 IMPLANT
SUT MNCRL AB 3-0 PS2 18 (SUTURE) ×3 IMPLANT
SUT VIC AB 0 CT1 36 (SUTURE) ×6 IMPLANT
SUT VIC AB 1 CT1 36 (SUTURE) ×6 IMPLANT
SUT VIC AB 2-0 CT1 27 (SUTURE) ×2
SUT VIC AB 2-0 CT1 TAPERPNT 27 (SUTURE) ×1 IMPLANT
SWABSTK COMLB BENZOIN TINCTURE (MISCELLANEOUS) ×3 IMPLANT
SYR 20CC LL (SYRINGE) IMPLANT
SYR 50ML LL SCALE MARK (SYRINGE) ×3 IMPLANT
SYRINGE 20CC LL (MISCELLANEOUS) ×3 IMPLANT
TOWEL OR NON WOVEN STRL DISP B (DISPOSABLE) ×3 IMPLANT
TRAY FOLEY BAG SILVER LF 16FR (CATHETERS) IMPLANT
WATER STERILE IRR 1000ML POUR (IV SOLUTION) ×6 IMPLANT
WRAP KNEE MAXI GEL POST OP (GAUZE/BANDAGES/DRESSINGS) ×3 IMPLANT
YANKAUER SUCT BULB TIP NO VENT (SUCTIONS) ×3 IMPLANT

## 2016-11-24 NOTE — Op Note (Signed)
NAME:  Kayla Waters, Kayla Waters                   ACCOUNT NO.:  MEDICAL RECORD NO.:  [id number]  LOCATION:                                 FACILITY:  PHYSICIAN:  Alta Corning, M.D.        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  PROCEDURE:  Right total knee replacement with Attune system size 6 femur, size 6 tibia, 38-mm patella, and a 7-mm bridging bearing.  SURGEON:  Alta Corning, M.D.  ASSISTModena Slater.  ANESTHESIA:  Spinal.  BRIEF HISTORY:  Kayla Waters is a 71 year old female long history and significant complaints of right knee pain.  She had been treated conservatively for prolonged period of time.  She has had failure of injection therapy, viscous supplementation, activity modification. Because of failure of all conservative care, she was taken to the operating room for right total knee replacement.  The patient was having night pain and light activity pain prior to surgical intervention.  DESCRIPTION OF PROCEDURE:  The patient was taken to the operating room, and after adequate anesthesia was obtained with spinal anesthetic, the patient was placed supine on the operating table.  The right leg was prepped and draped in usual sterile fashion.  Following this, the leg was exsanguinated.  Blood pressure tourniquet was inflated to 300 mmHg. Following this, a midline incision was made in the subcutaneous tissue, dissected down to the level of the extensor mechanism.  A medial parapatellar arthrotomy was undertaken.  Following this, attention was turned to the removal of the synovium in the anterior aspect of the femur, retropatellar fat pad, synovium on the anterior aspect of the femur, medial and  lateral meniscus, and anterior and posterior cruciates.  Following this, attention was turned to the femur where an intramedullary pilot hole was drilled.  A 4-degree valgus inclination cut was made, and a distal femoral cut was made of 9 mm.  Following this, attention was turned to the femur which was sized to a 6. Anterior and posterior cuts were made, chamfers and box.  Attention was then turned to the tibia that was cut perpendicular to its long axis. It was drilled and keeled and sized to a 6.  A rotational alignment was then taken and it was then drilled and keeled.  Following this, the trial with a 7 poly was put in place.  Knee was put through a range of motion.  Excellent stability and range of motion were achieved. Attention was turned to the patella, cut down to a level of 13 mm.  38 paddle was chosen, lugs were drilled.  Following this, attention was turned towards removal of trial components and the final components were then cemented into place.  Size 6 tibia, size 6 femur, 38-mm all-poly patella, and a 7-mm bridging bearing trial were placed.  Cement was allowed to harden completely and all excess bone cement was removed. Attention was then turned towards the letting the  tourniquet down and all bleeding was controlled with electrocautery, and Exparel with Marcaine and saline are instilled throughout the synovial reflection for postoperative anesthesia.  Following this, the final 7 poly was placed. Knee was put through a range of motion and excellent stability and range of motion were achieved.  The medial parapatellar arthrotomy was closed with 1 Vicryl running, the skin with 0 and 2-0 Vicryl, and 3- 0 Monocryl subcuticular.  Benzoin and Steri-Strips were applied. Sterile compressive dressing was applied.  The patient was taken to the recovery room where he was noted to be in satisfactory condition. Estimated blood loss for procedure was minimal.     Alta Corning,  M.D.     Corliss Skains  D:  11/24/2016  T:  11/24/2016  Job:  [id number]

## 2016-11-24 NOTE — Evaluation (Signed)
Physical Therapy Evaluation Patient Details Name: Kayla Waters MRN: [id number] DOB: 05-26-1945 Today's Date: 11/24/2016   History of Present Illness  Pt s/p R TKR and with hx of L TKR in 2011  Clinical Impression  Pt s/p R TKR and presents with decreased R LE strength/ROM and post op pain and nausea limiting functional mobility.  Pt should progress to dc home with family assist.    Follow Up Recommendations DC plan and follow up therapy as arranged by surgeon (Pt plans [facility])    Equipment Recommendations  None recommended by PT    Recommendations for Other Services OT consult     Precautions / Restrictions Precautions Precautions: Knee;Fall Required Braces or Orthoses: Knee Immobilizer - Right Knee Immobilizer - Right: Discontinue once straight leg raise with < 10 degree lag Restrictions Weight Bearing Restrictions: No Other Position/Activity Restrictions: WBAT      Mobility  Bed Mobility Overal bed mobility: Needs Assistance Bed Mobility: Supine to Sit     Supine to sit: Min assist     General bed mobility comments: cues for sequence and use of R LE to self assist  Transfers Overall transfer level: Needs assistance Equipment used: Rolling walker (2 wheeled) Transfers: Sit to/from Stand Sit to Stand: Min assist         General transfer comment: cues for LE management and use of UEs to self assist  Ambulation/Gait Ambulation/Gait assistance: Min assist Ambulation Distance (Feet): 8 Feet Assistive device: Rolling walker (2 wheeled) Gait Pattern/deviations: Step-to pattern;Decreased step length - right;Decreased step length - left;Shuffle;Trunk flexed Gait velocity: decr Gait velocity interpretation: Below normal speed for age/gender General Gait Details: cues for sequence, posture and position from RW; distance ltd by nausea  Stairs            Wheelchair Mobility    Modified Rankin (Stroke Patients Only)       Balance                                              Pertinent Vitals/Pain Pain Assessment: 0-10 Pain Score: 5  Pain Location: R knee Pain Descriptors / Indicators: Aching;Sore Pain Intervention(s): Limited activity within patient's tolerance;Monitored during session;Premedicated before session;Ice applied    Home Living Family/patient expects to be discharged to:: Private residence Living Arrangements: Spouse/significant other Available Help at Discharge: Family Type of Home: House Home Access: Level entry     Home Layout: One level Home Equipment: Environmental consultant - standard      Prior Function Level of Independence: Independent               Hand Dominance        Extremity/Trunk Assessment   Upper Extremity Assessment Upper Extremity Assessment: Overall WFL for tasks assessed    Lower Extremity Assessment Lower Extremity Assessment: RLE deficits/detail    Cervical / Trunk Assessment Cervical / Trunk Assessment: Normal  Communication   Communication: No difficulties  Cognition Arousal/Alertness: Awake/alert Behavior During Therapy: WFL for tasks assessed/performed Overall Cognitive Status: Within Functional Limits for tasks assessed                                        General Comments      Exercises     Assessment/Plan    PT Assessment Patient  needs continued PT services  PT Problem List Decreased strength;Decreased range of motion;Decreased activity tolerance;Decreased mobility;Decreased knowledge of use of DME;Pain       PT Treatment Interventions DME instruction;Gait training;Stair training;Functional mobility training;Therapeutic activities;Therapeutic exercise;Patient/family education    PT Goals (Current goals can be found in the Care Plan section)  Acute Rehab PT Goals Patient Stated Goal: Regain IND in 10 weeks to care for grandson PT Goal Formulation: With patient Time For Goal Achievement: 11/29/16 Potential to Achieve Goals:  Good    Frequency 7X/week   Barriers to discharge        Co-evaluation               AM-PAC PT "6 Clicks" Daily Activity  Outcome Measure Difficulty turning over in bed (including adjusting bedclothes, sheets and blankets)?: A Lot Difficulty moving from lying on back to sitting on the side of the bed? : A Lot Difficulty sitting down on and standing up from a chair with arms (e.g., wheelchair, bedside commode, etc,.)?: A Lot Help needed moving to and from a bed to chair (including a wheelchair)?: A Little Help needed walking in hospital room?: A Little Help needed climbing 3-5 steps with a railing? : A Lot 6 Click Score: 14    End of Session Equipment Utilized During Treatment: Gait belt;Right knee immobilizer Activity Tolerance: Patient limited by fatigue Patient left: in chair;with call bell/phone within reach;with family/visitor present Nurse Communication: Mobility status PT Visit Diagnosis: Unsteadiness on feet (R26.81);Difficulty in walking, not elsewhere classified (R26.2)    Time: 0623-7628 PT Time Calculation (min) (ACUTE ONLY): 35 min   Charges:   PT Evaluation $PT Eval Low Complexity: 1 Low PT Treatments $Gait Training: 8-22 mins   PT G Codes:        Pg [phone]   Michaeline Eckersley 11/24/2016, 6:31 PM

## 2016-11-24 NOTE — Brief Op Note (Signed)
11/24/2016  9:05 AM  PATIENT:  Kayla Waters  71 y.o. female  PRE-OPERATIVE DIAGNOSIS:  OSTEOARTHRITIS RIGHT KNEE  POST-OPERATIVE DIAGNOSIS:  OSTEOARTHRITIS RIGHT KNEE  PROCEDURE:  Procedure(s) with comments: RIGHT TOTAL KNEE ARTHROPLASTY (Right) - with abdutor block  SURGEON:  Surgeon(s) and Role:    Dorna Leitz, MD - Primary  PHYSICIAN ASSISTANT:   ASSISTANTS: bethune   ANESTHESIA:   spinal  EBL:  Total I/O In: 2000 [I.V.:2000] Out: -   BLOOD ADMINISTERED:none  DRAINS: none   LOCAL MEDICATIONS USED:  MARCAINE    and OTHER experel  SPECIMEN:  No Specimen  DISPOSITION OF SPECIMEN:  N/A  COUNTS:  YES  TOURNIQUET:   Total Tourniquet Time Documented: Thigh (Right) - 55 minutes Total: Thigh (Right) - 55 minutes   DICTATION: .Other Dictation: Dictation Number [id number]  PLAN OF CARE: Admit to inpatient   PATIENT DISPOSITION:  PACU - hemodynamically stable.   Delay start of Pharmacological VTE agent (>24hrs) due to surgical blood loss or risk of bleeding: no

## 2016-11-24 NOTE — Progress Notes (Signed)
Patient's allergy to Aspirin is hives related to mono several years ago per spouse. Attending Provider verbally gave order for Asprin BID and discontinued Eliquis. If there is no adverse drug reaction Aspirin will continue. However, if there is a adverse drug reaction Eliquis will be orderd tomorrow 10/13 per Attending Provider. Will continue to monitor patient and make the oncoming RN aware of medication issues.

## 2016-11-24 NOTE — Anesthesia Procedure Notes (Signed)
Anesthesia Regional Block: Adductor canal block   Pre-Anesthetic Checklist: ,, timeout performed, Correct Patient, Correct Site, Correct Laterality, Correct Procedure, Correct Position, site marked, Risks and benefits discussed, pre-op evaluation,  At surgeon's request and post-op pain management  Laterality: Right  Prep: Maximum Sterile Barrier Precautions used, chloraprep       Needles:  Injection technique: Single-shot  Needle Type: Echogenic Stimulator Needle     Needle Length: 9cm  Needle Gauge: 21     Additional Needles:   Narrative:  Start time: 11/24/2016 7:01 AM End time: 11/24/2016 7:11 AM Injection made incrementally with aspirations every 5 mL. Anesthesiologist: Roderic Palau  Additional Notes: 2% Lidocaine skin wheel.

## 2016-11-24 NOTE — H&P (Signed)
TOTAL KNEE ADMISSION H&P  Patient is being admitted for right total knee arthroplasty.  Subjective:  Chief Complaint:right knee pain.  HPI: Kayla Waters, 71 y.o. female, has a history of pain and functional disability in the right knee due to arthritis and has failed non-surgical conservative treatments for greater than 12 weeks to includeNSAID's and/or analgesics, corticosteriod injections, viscosupplementation injections, weight reduction as appropriate and activity modification.  Onset of symptoms was gradual, starting 5 years ago with gradually worsening course since that time. The patient noted no past surgery on the right knee(s).  Patient currently rates pain in the right knee(s) at 9 out of 10 with activity. Patient has night pain, worsening of pain with activity and weight bearing, pain that interferes with activities of daily living, crepitus and joint swelling.  Patient has evidence of subchondral sclerosis, periarticular osteophytes and joint space narrowing by imaging studies. This patient has had failure of all reasonable conservative care. There is no active infection.  There are no active problems to display for this patient.  Past Medical History:  Diagnosis Date  . Depression   . Dyslipidemia   . Elevated liver function tests   . Osteoarthritis   . Osteopenia   . PONV (postoperative nausea and vomiting)    evening  post Left TKA ; patient reports having severe nausea and vomiting and was given phenergan after which she states " i stopped breathing and my husband had to call repiratory therapy and they had head-tilt, chin thrust me " . patient unaware if event caused by anesthesia or phenergan . denies having phenergan since that time   . Radius fracture    left     Past Surgical History:  Procedure Laterality Date  . ABDOMINAL HYSTERECTOMY     1989  . JOINT REPLACEMENT     left TKA ; 11-21- 2011   . MYOMECTOMY  1976  . WRIST SURGERY     " i had cysts removed and  carpal tuunnerl surgery "     Prescriptions Prior to Admission  Medication Sig Dispense Refill Last Dose  . CALCIUM-MAGNESIUM PO Take 2 tablets by mouth daily.   11/14/2016  . Cholecalciferol (VITAMIN D3 PO) Take 1 capsule by mouth daily.   11/14/2016  . Magnesium 100 MG CAPS Take 100 mg by mouth daily.   11/14/2016  . TURMERIC PO Take 2 capsules by mouth daily.   11/14/2016  . VIIBRYD 10 MG TABS Take 10 mg by mouth See admin instructions. Take 10 mg by mouth 6 times weekly  0 11/23/2016   Allergies  Allergen Reactions  . Aspirin Hives  . Morphine And Related Itching  . Penicillins Hives  . Sulfa Antibiotics Other (See Comments)    Fixed drug eruption   . Macrodantin [Nitrofurantoin] Rash    Social History  Substance Use Topics  . Smoking status: Never Smoker  . Smokeless tobacco: Never Used  . Alcohol use 1.2 oz/week    2 Glasses of wine per week    Family History  Problem Relation Age of Onset  . Breast cancer Mother   . Breast cancer Sister   . Breast cancer Maternal Aunt      ROS ROS: I have reviewed the patient's review of systems thoroughly and there are no positive responses as relates to the HPI. Objective:  Physical Exam  Vital signs in last 24 hours: Temp:  [97.9 F (36.6 C)] 97.9 F (36.6 C) (10/12 0532) Pulse Rate:  [65] 65 (10/12  0532) Resp:  [16] 16 (10/12 0532) BP: (112)/(61) 112/61 (10/12 0532) SpO2:  [98 %] 98 % (10/12 0532) Weight:  [81.8 kg (180 lb 6.4 oz)] 81.8 kg (180 lb 6.4 oz) (10/12 0543) Well-developed well-nourished patient in no acute distress. Alert and oriented x3 HEENT:within normal limits Cardiac: Regular rate and rhythm Pulmonary: Lungs clear to auscultation Abdomen: Soft and nontender.  Normal active bowel sounds  Musculoskeletal: (right knee: Limited range of motion. Painful range of motion. Tender palpation over the medial joint line. No instability. Trace effusion. Labs: Recent Results (from the past 2160 hour(s))  Surgical pcr  screen     Status: None   Collection Time: 11/22/16  1:25 PM  Result Value Ref Range   MRSA, PCR NEGATIVE NEGATIVE   Staphylococcus aureus NEGATIVE NEGATIVE    Comment: (NOTE) The Xpert SA Assay (FDA approved for NASAL specimens in patients 68 years of age and older), is one component of a comprehensive surveillance program. It is not intended to diagnose infection nor to guide or monitor treatment.   Type and screen Order type and screen if day of surgery is less than 15 days from draw of preadmission visit or order morning of surgery if day of surgery is greater than 6 days from preadmission visit.     Status: None   Collection Time: 11/22/16  1:46 PM  Result Value Ref Range   ABO/RH(D) O POS    Antibody Screen NEG    Sample Expiration 11/27/2016    Extend sample reason NO TRANSFUSIONS OR PREGNANCY IN THE PAST 3 MONTHS   ABO/Rh     Status: None   Collection Time: 11/22/16  1:46 PM  Result Value Ref Range   ABO/RH(D) O POS   Urinalysis, Routine w reflex microscopic     Status: Abnormal   Collection Time: 11/22/16  1:51 PM  Result Value Ref Range   Color, Urine YELLOW YELLOW   APPearance CLEAR CLEAR   Specific Gravity, Urine 1.009 1.005 - 1.030   pH 6.0 5.0 - 8.0   Glucose, UA NEGATIVE NEGATIVE mg/dL   Hgb urine dipstick NEGATIVE NEGATIVE   Bilirubin Urine NEGATIVE NEGATIVE   Ketones, ur NEGATIVE NEGATIVE mg/dL   Protein, ur NEGATIVE NEGATIVE mg/dL   Nitrite NEGATIVE NEGATIVE   Leukocytes, UA TRACE (A) NEGATIVE   RBC / HPF 0-5 0 - 5 RBC/hpf   WBC, UA 0-5 0 - 5 WBC/hpf   Bacteria, UA NONE SEEN NONE SEEN   Squamous Epithelial / LPF 0-5 (A) NONE SEEN   Mucus PRESENT   APTT     Status: None   Collection Time: 11/22/16  1:51 PM  Result Value Ref Range   aPTT 28 24 - 36 seconds  CBC WITH DIFFERENTIAL     Status: None   Collection Time: 11/22/16  1:51 PM  Result Value Ref Range   WBC 6.2 4.0 - 10.5 K/uL   RBC 4.89 3.87 - 5.11 MIL/uL   Hemoglobin 14.6 12.0 - 15.0 g/dL   HCT  43.4 36.0 - 46.0 %   MCV 88.8 78.0 - 100.0 fL   MCH 29.9 26.0 - 34.0 pg   MCHC 33.6 30.0 - 36.0 g/dL   RDW 14.0 11.5 - 15.5 %   Platelets 384 150 - 400 K/uL   Neutrophils Relative % 59 %   Neutro Abs 3.7 1.7 - 7.7 K/uL   Lymphocytes Relative 28 %   Lymphs Abs 1.8 0.7 - 4.0 K/uL   Monocytes Relative 10 %  Monocytes Absolute 0.6 0.1 - 1.0 K/uL   Eosinophils Relative 2 %   Eosinophils Absolute 0.1 0.0 - 0.7 K/uL   Basophils Relative 1 %   Basophils Absolute 0.1 0.0 - 0.1 K/uL  Comprehensive metabolic panel     Status: Abnormal   Collection Time: 11/22/16  1:51 PM  Result Value Ref Range   Sodium 138 135 - 145 mmol/L   Potassium 4.5 3.5 - 5.1 mmol/L   Chloride 103 101 - 111 mmol/L   CO2 25 22 - 32 mmol/L   Glucose, Bld 95 65 - 99 mg/dL   BUN 12 6 - 20 mg/dL   Creatinine, Ser 0.81 0.44 - 1.00 mg/dL   Calcium 9.7 8.9 - 10.3 mg/dL   Total Protein 7.1 6.5 - 8.1 g/dL   Albumin 4.4 3.5 - 5.0 g/dL   AST 56 (H) 15 - 41 U/L   ALT 83 (H) 14 - 54 U/L   Alkaline Phosphatase 109 38 - 126 U/L   Total Bilirubin 0.4 0.3 - 1.2 mg/dL   GFR calc non Af Amer >60 >60 mL/min   GFR calc Af Amer >60 >60 mL/min    Comment: (NOTE) The eGFR has been calculated using the CKD EPI equation. This calculation has not been validated in all clinical situations. eGFR's persistently <60 mL/min signify possible Chronic Kidney Disease.    Anion gap 10 5 - 15  Protime-INR     Status: None   Collection Time: 11/22/16  1:51 PM  Result Value Ref Range   Prothrombin Time 11.8 11.4 - 15.2 seconds   INR 0.87     Estimated body mass index is 26.64 kg/m as calculated from the following:   Height as of this encounter: '5\' 9"'  (1.753 m).   Weight as of this encounter: 81.8 kg (180 lb 6.4 oz).   Imaging Review Plain radiographs demonstrate severe degenerative joint disease of the right knee(s). The overall alignment ismild varus. The bone quality appears to be good for age and reported activity  level.  Assessment/Plan:  End stage arthritis, right knee   The patient history, physical examination, clinical judgment of the provider and imaging studies are consistent with end stage degenerative joint disease of the right knee(s) and total knee arthroplasty is deemed medically necessary. The treatment options including medical management, injection therapy arthroscopy and arthroplasty were discussed at length. The risks and benefits of total knee arthroplasty were presented and reviewed. The risks due to aseptic loosening, infection, stiffness, patella tracking problems, thromboembolic complications and other imponderables were discussed. The patient acknowledged the explanation, agreed to proceed with the plan and consent was signed. Patient is being admitted for inpatient treatment for surgery, pain control, PT, OT, prophylactic antibiotics, VTE prophylaxis, progressive ambulation and ADL's and discharge planning. The patient is planning to be discharged home with home health services

## 2016-11-24 NOTE — Transfer of Care (Signed)
Immediate Anesthesia Transfer of Care Note  Patient: Kayla Waters  Procedure(s) Performed: RIGHT TOTAL KNEE ARTHROPLASTY (Right Knee)  Patient Location: PACU  Anesthesia Type:Spinal  Level of Consciousness: awake, alert  and oriented  Airway & Oxygen Therapy: Patient Spontanous Breathing and Patient connected to face mask oxygen  Post-op Assessment: Report given to RN and Post -op Vital signs reviewed and stable  Post vital signs: Reviewed and stable  Last Vitals:  Vitals:   11/24/16 0532  BP: 112/61  Pulse: 65  Resp: 16  Temp: 36.6 C  SpO2: 98%    Last Pain:  Vitals:   11/24/16 0532  TempSrc: Oral      Patients Stated Pain Goal: 5 (99/37/16 9678)  Complications: No apparent anesthesia complications

## 2016-11-24 NOTE — Discharge Instructions (Signed)
INSTRUCTIONS AFTER JOINT REPLACEMENT   o Remove items at home which could result in a fall. This includes throw rugs or furniture in walking pathways o ICE to the affected joint every three hours while awake for 30 minutes at a time, for at least the first 3-5 days, and then as needed for pain and swelling.  Continue to use ice for pain and swelling. You may notice swelling that will progress down to the foot and ankle.  This is normal after surgery.  Elevate your leg when you are not up walking on it.   o Continue to use the breathing machine you got in the hospital (incentive spirometer) which will help keep your temperature down.  It is common for your temperature to cycle up and down following surgery, especially at night when you are not up moving around and exerting yourself.  The breathing machine keeps your lungs expanded and your temperature down.   DIET:  As you were doing prior to hospitalization, we recommend a well-balanced diet.  Information on my medicine - ELIQUIS (apixaban)  This medication education was reviewed with me or my healthcare representative as part of my discharge preparation.  The pharmacist that spoke with me during my hospital stay was:  Kara Mead, [facility]  Why was Eliquis prescribed for you? Eliquis was prescribed for you to reduce the risk of blood clots forming after orthopedic surgery.    What do You need to know about Eliquis? Take your Eliquis TWICE DAILY - one tablet in the morning and one tablet in the evening with or without food.  It would be best to take the dose about the same time each day.  If you have difficulty swallowing the tablet whole please discuss with your pharmacist how to take the medication safely.  Take Eliquis exactly as prescribed by your doctor and DO NOT stop taking Eliquis without talking to the doctor who prescribed the medication.  Stopping without other medication to take the place of Eliquis may increase your  risk of developing a clot.  After discharge, you should have regular check-up appointments with your healthcare provider that is prescribing your Eliquis.  What do you do if you miss a dose? If a dose of ELIQUIS is not taken at the scheduled time, take it as soon as possible on the same day and twice-daily administration should be resumed.  The dose should not be doubled to make up for a missed dose.  Do not take more than one tablet of ELIQUIS at the same time.  Important Safety Information A possible side effect of Eliquis is bleeding. You should call your healthcare provider right away if you experience any of the following: ? Bleeding from an injury or your nose that does not stop. ? Unusual colored urine (red or dark brown) or unusual colored stools (red or black). ? Unusual bruising for unknown reasons. ? A serious fall or if you hit your head (even if there is no bleeding).  Some medicines may interact with Eliquis and might increase your risk of bleeding or clotting while on Eliquis. To help avoid this, consult your healthcare provider or pharmacist prior to using any new prescription or non-prescription medications, including herbals, vitamins, non-steroidal anti-inflammatory drugs (NSAIDs) and supplements.  This website has more information on Eliquis (apixaban): http://www.eliquis.com/eliquis/home   DRESSING / WOUND CARE / SHOWERING  Keep the surgical dressing until follow up.  The dressing is water proof, so you can shower without any extra covering.  IF THE DRESSING FALLS OFF or the wound gets wet inside, change the dressing with sterile gauze.  Please use good hand washing techniques before changing the dressing.  Do not use any lotions or creams on the incision until instructed by your surgeon.    ACTIVITY  o Increase activity slowly as tolerated, but follow the weight bearing instructions below.   o No driving for 6 weeks or until further direction given by your  physician.  You cannot drive while taking narcotics.  o No lifting or carrying greater than 10 lbs. until further directed by your surgeon. o Avoid periods of inactivity such as sitting longer than an hour when not asleep. This helps prevent blood clots.  o You may return to work once you are authorized by your doctor.     WEIGHT BEARING   Weight bearing as tolerated with assist device (walker, cane, etc) as directed, use it as long as suggested by your surgeon or therapist, typically at least 4-6 weeks.   EXERCISES  Results after joint replacement surgery are often greatly improved when you follow the exercise, range of motion and muscle strengthening exercises prescribed by your doctor. Safety measures are also important to protect the joint from further injury. Any time any of these exercises cause you to have increased pain or swelling, decrease what you are doing until you are comfortable again and then slowly increase them. If you have problems or questions, call your caregiver or physical therapist for advice.   Rehabilitation is important following a joint replacement. After just a few days of immobilization, the muscles of the leg can become weakened and shrink (atrophy).  These exercises are designed to build up the tone and strength of the thigh and leg muscles and to improve motion. Often times heat used for twenty to thirty minutes before working out will loosen up your tissues and help with improving the range of motion but do not use heat for the first two weeks following surgery (sometimes heat can increase post-operative swelling).   These exercises can be done on a training (exercise) mat, on the floor, on a table or on a bed. Use whatever works the best and is most comfortable for you.    Use music or television while you are exercising so that the exercises are a pleasant break in your day. This will make your life better with the exercises acting as a break in your routine that  you can look forward to.   Perform all exercises about fifteen times, three times per day or as directed.  You should exercise both the operative leg and the other leg as well.  Exercises include:    Quad Sets - Tighten up the muscle on the front of the thigh (Quad) and hold for 5-10 seconds.    Straight Leg Raises - With your knee straight (if you were given a brace, keep it on), lift the leg to 60 degrees, hold for 3 seconds, and slowly lower the leg.  Perform this exercise against resistance later as your leg gets stronger.   Leg Slides: Lying on your back, slowly slide your foot toward your buttocks, bending your knee up off the floor (only go as far as is comfortable). Then slowly slide your foot back down until your leg is flat on the floor again.   Angel Wings: Lying on your back spread your legs to the side as far apart as you can without causing discomfort.   Hamstring Strength:  Lying on your back, push your heel against the floor with your leg straight by tightening up the muscles of your buttocks.  Repeat, but this time bend your knee to a comfortable angle, and push your heel against the floor.  You may put a pillow under the heel to make it more comfortable if necessary.   A rehabilitation program following joint replacement surgery can speed recovery and prevent re-injury in the future due to weakened muscles. Contact your doctor or a physical therapist for more information on knee rehabilitation.    CONSTIPATION  Constipation is defined medically as fewer than three stools per week and severe constipation as less than one stool per week.  Even if you have a regular bowel pattern at home, your normal regimen is likely to be disrupted due to multiple reasons following surgery.  Combination of anesthesia, postoperative narcotics, change in appetite and fluid intake all can affect your bowels.   YOU MUST use at least one of the following options; they are listed in order of  increasing strength to get the job done.  They are all available over the counter, and you may need to use some, POSSIBLY even all of these options:    Drink plenty of fluids (prune juice may be helpful) and high fiber foods Colace 100 mg by mouth twice a day  Senokot for constipation as directed and as needed Dulcolax (bisacodyl), take with full glass of water  Miralax (polyethylene glycol) once or twice a day as needed.  If you have tried all these things and are unable to have a bowel movement in the first 3-4 days after surgery call either your surgeon or your primary doctor.    If you experience loose stools or diarrhea, hold the medications until you stool forms back up.  If your symptoms do not get better within 1 week or if they get worse, check with your doctor.  If you experience "the worst abdominal pain ever" or develop nausea or vomiting, please contact the office immediately for further recommendations for treatment.   ITCHING:  If you experience itching with your medications, try taking only a single pain pill, or even half a pain pill at a time.  You can also use Benadryl over the counter for itching or also to help with sleep.   TED HOSE STOCKINGS:  Use stockings on both legs until for at least 2 weeks or as directed by physician office. They may be removed at night for sleeping.  MEDICATIONS:  See your medication summary on the After Visit Summary that nursing will review with you.  You may have some home medications which will be placed on hold until you complete the course of blood thinner medication.  It is important for you to complete the blood thinner medication as prescribed.  PRECAUTIONS:  If you experience chest pain or shortness of breath - call 911 immediately for transfer to the hospital emergency department.   If you develop a fever greater that 101 F, purulent drainage from wound, increased redness or drainage from wound, foul odor from the wound/dressing, or  calf pain - CONTACT YOUR SURGEON.                                                   FOLLOW-UP APPOINTMENTS:  If you do not already have a post-op  appointment, please call the office for an appointment to be seen by your surgeon.  Guidelines for how soon to be seen are listed in your After Visit Summary, but are typically between 1-4 weeks after surgery.  OTHER INSTRUCTIONS:   Knee Replacement:  Do not place pillow under knee, focus on keeping the knee straight while resting. CPM instructions: 0-90 degrees, 2 hours in the morning, 2 hours in the afternoon, and 2 hours in the evening. Place foam block, curve side up under heel at all times except when in CPM or when walking.  DO NOT modify, tear, cut, or change the foam block in any way.  MAKE SURE YOU:   Understand these instructions.   Get help right away if you are not doing well or get worse.    Thank you for letting us be a part of your medical care team.  It is a privilege we respect greatly.  We hope these instructions will help you stay on track for a fast and full recovery!

## 2016-11-24 NOTE — Anesthesia Postprocedure Evaluation (Signed)
Anesthesia Post Note  Patient: Kayla Waters  Procedure(s) Performed: RIGHT TOTAL KNEE ARTHROPLASTY (Right Knee)     Patient location during evaluation: PACU Anesthesia Type: Spinal and Regional Level of consciousness: awake and alert Pain management: pain level controlled Vital Signs Assessment: post-procedure vital signs reviewed and stable Respiratory status: spontaneous breathing and respiratory function stable Cardiovascular status: blood pressure returned to baseline and stable Postop Assessment: spinal receding and no apparent nausea or vomiting Anesthetic complications: no    Last Vitals:  Vitals:   11/24/16 1000 11/24/16 1015  BP: 100/61 102/61  Pulse:  62  Resp:  15  Temp:  36.6 C  SpO2:  100%    Last Pain:  Vitals:   11/24/16 1015  TempSrc:   PainSc: 3                  Estephany Perot,W. EDMOND

## 2016-11-24 NOTE — Progress Notes (Signed)
AssistedDr. Edmond Fitzgerald with right, ultrasound guided, adductor canal block. Side rails up, monitors on throughout procedure. See vital signs in flow sheet. Tolerated Procedure well.  

## 2016-11-24 NOTE — Anesthesia Procedure Notes (Signed)
Spinal  Patient location during procedure: OR End time: 11/24/2016 7:32 AM Staffing Resident/CRNA: Noralyn Pick D Performed: anesthesiologist  Preanesthetic Checklist Completed: patient identified, site marked, surgical consent, pre-op evaluation, timeout performed, IV checked, risks and benefits discussed and monitors and equipment checked Spinal Block Patient position: sitting Prep: Betadine Patient monitoring: heart rate, continuous pulse ox and blood pressure Approach: midline Location: L3-4 Injection technique: single-shot Needle Needle type: Sprotte  Needle gauge: 24 G Needle length: 9 cm Assessment Sensory level: T6 Additional Notes Expiration date of kit checked and confirmed. Patient tolerated procedure well, without complications.

## 2016-11-25 DIAGNOSIS — M1711 Unilateral primary osteoarthritis, right knee: Secondary | ICD-10-CM | POA: Diagnosis not present

## 2016-11-25 DIAGNOSIS — Z01812 Encounter for preprocedural laboratory examination: Secondary | ICD-10-CM | POA: Diagnosis not present

## 2016-11-25 DIAGNOSIS — Z79899 Other long term (current) drug therapy: Secondary | ICD-10-CM | POA: Diagnosis not present

## 2016-11-25 DIAGNOSIS — Z01810 Encounter for preprocedural cardiovascular examination: Secondary | ICD-10-CM | POA: Diagnosis not present

## 2016-11-25 DIAGNOSIS — M8580 Other specified disorders of bone density and structure, unspecified site: Secondary | ICD-10-CM | POA: Diagnosis not present

## 2016-11-25 DIAGNOSIS — E785 Hyperlipidemia, unspecified: Secondary | ICD-10-CM | POA: Diagnosis not present

## 2016-11-25 LAB — CBC
HCT: 34 % — ABNORMAL LOW (ref 36.0–46.0)
Hemoglobin: 11.5 g/dL — ABNORMAL LOW (ref 12.0–15.0)
MCH: 30.1 pg (ref 26.0–34.0)
MCHC: 33.8 g/dL (ref 30.0–36.0)
MCV: 89 fL (ref 78.0–100.0)
PLATELETS: 276 10*3/uL (ref 150–400)
RBC: 3.82 MIL/uL — ABNORMAL LOW (ref 3.87–5.11)
RDW: 13.9 % (ref 11.5–15.5)
WBC: 10.4 10*3/uL (ref 4.0–10.5)

## 2016-11-25 MED ORDER — ASPIRIN 325 MG PO TABS: 325.0000 mg | ORAL_TABLET | Freq: Two times a day (BID) | ORAL | 0 refills | Status: AC

## 2016-11-25 NOTE — Progress Notes (Signed)
Physical Therapy Treatment Patient Details Name: Kayla Waters MRN: [id number] DOB: Jan 19, 1946 Today's Date: 11/25/2016    History of Present Illness Pt s/p R TKR and with hx of L TKR in 2011    PT Comments    Pt very motivated and progressing well with mobility.  Pt eager for dc home and will begin virtual therapy tomorrow am.   Follow Up Recommendations  DC plan and follow up therapy as arranged by surgeon     Equipment Recommendations  None recommended by PT    Recommendations for Other Services OT consult     Precautions / Restrictions Precautions Precautions: Knee;Fall Required Braces or Orthoses: Knee Immobilizer - Right Knee Immobilizer - Right: Discontinue once straight leg raise with < 10 degree lag Restrictions Weight Bearing Restrictions: No Other Position/Activity Restrictions: WBAT    Mobility  Bed Mobility Overal bed mobility: Needs Assistance Bed Mobility: Supine to Sit;Sit to Supine     Supine to sit: Supervision Sit to supine: Supervision   General bed mobility comments: cues for sequence and use of R LE to self assist  Transfers Overall transfer level: Needs assistance Equipment used: Rolling walker (2 wheeled) Transfers: Sit to/from Stand Sit to Stand: Supervision         General transfer comment: min cues for LE management and use of UEs to self assist  Ambulation/Gait Ambulation/Gait assistance: Min guard;Supervision Ambulation Distance (Feet): 200 Feet Assistive device: Rolling walker (2 wheeled) Gait Pattern/deviations: Step-to pattern;Step-through pattern;Decreased step length - right;Decreased step length - left;Shuffle;Trunk flexed Gait velocity: decr Gait velocity interpretation: Below normal speed for age/gender General Gait Details: cues for sequence, posture and position from RW; distance ltd by nausea   Stairs            Wheelchair Mobility    Modified Rankin (Stroke Patients Only)       Balance                                             Cognition Arousal/Alertness: Awake/alert Behavior During Therapy: WFL for tasks assessed/performed Overall Cognitive Status: Within Functional Limits for tasks assessed                                        Exercises Total Joint Exercises Ankle Circles/Pumps: AROM;Both;15 reps;Supine Quad Sets: AROM;Both;Supine;15 reps Heel Slides: AAROM;Right;15 reps;Supine Straight Leg Raises: AAROM;AROM;Right;15 reps;Supine    General Comments        Pertinent Vitals/Pain Pain Assessment: 0-10 Pain Score: 4  Pain Location: R knee Pain Descriptors / Indicators: Aching;Sore Pain Intervention(s): Limited activity within patient's tolerance;Monitored during session;Premedicated before session;Ice applied    Home Living                      Prior Function            PT Goals (current goals can now be found in the care plan section) Acute Rehab PT Goals Patient Stated Goal: Regain IND in 10 weeks to care for grandson PT Goal Formulation: With patient Time For Goal Achievement: 11/29/16 Potential to Achieve Goals: Good Progress towards PT goals: Progressing toward goals    Frequency    7X/week      PT Plan Current plan remains appropriate    Co-evaluation  AM-PAC PT "6 Clicks" Daily Activity  Outcome Measure  Difficulty turning over in bed (including adjusting bedclothes, sheets and blankets)?: A Lot Difficulty moving from lying on back to sitting on the side of the bed? : A Little Difficulty sitting down on and standing up from a chair with arms (e.g., wheelchair, bedside commode, etc,.)?: A Little Help needed moving to and from a bed to chair (including a wheelchair)?: A Little Help needed walking in hospital room?: A Little Help needed climbing 3-5 steps with a railing? : A Little 6 Click Score: 17    End of Session Equipment Utilized During Treatment: Gait belt Activity  Tolerance: Patient tolerated treatment well Patient left: in bed;with call bell/phone within reach;with family/visitor present Nurse Communication: Mobility status PT Visit Diagnosis: Difficulty in walking, not elsewhere classified (R26.2)     Time: 5053-9767 PT Time Calculation (min) (ACUTE ONLY): 47 min  Charges:  $Gait Training: 8-22 mins $Therapeutic Exercise: 8-22 mins                    G Codes:       Pg [phone]    Kaci Dillie 11/25/2016, 5:32 PM

## 2016-11-25 NOTE — Care Management Obs Status (Signed)
[facility] NOTIFICATION   Patient Details  Name: Kayla Waters MRN: [id number] Date of Birth: 02-12-1946   Medicare Observation Status Notification Given:  Yes    Erenest Rasher, RN 11/25/2016, 3:48 PM

## 2016-11-25 NOTE — Progress Notes (Signed)
Physical Therapy Treatment Patient Details Name: Kayla Waters MRN: [id number] DOB: [birth year] Today's Date: 11/25/2016    History of Present Illness Pt s/p R TKR and with hx of L TKR in 2011    PT Comments    Pt feeling much better and with marked progress with mobility this am.   Follow Up Recommendations  DC plan and follow up therapy as arranged by surgeon     Equipment Recommendations  None recommended by PT    Recommendations for Other Services OT consult     Precautions / Restrictions Precautions Precautions: Knee;Fall Required Braces or Orthoses: Knee Immobilizer - Right Knee Immobilizer - Right: Discontinue once straight leg raise with < 10 degree lag (Pt performed IND SLR this am) Restrictions Weight Bearing Restrictions: No Other Position/Activity Restrictions: WBAT    Mobility  Bed Mobility Overal bed mobility: Needs Assistance Bed Mobility: Supine to Sit     Supine to sit: Supervision     General bed mobility comments: cues for sequence and use of R LE to self assist  Transfers Overall transfer level: Needs assistance Equipment used: Rolling walker (2 wheeled) Transfers: Sit to/from Stand Sit to Stand: Min guard         General transfer comment: cues for LE management and use of UEs to self assist  Ambulation/Gait Ambulation/Gait assistance: Min guard;Supervision Ambulation Distance (Feet): 170 Feet Assistive device: Rolling walker (2 wheeled) Gait Pattern/deviations: Step-to pattern;Step-through pattern;Decreased step length - right;Decreased step length - left;Shuffle;Trunk flexed Gait velocity: decr Gait velocity interpretation: Below normal speed for age/gender General Gait Details: cues for sequence, posture and position from RW; distance ltd by nausea   Stairs            Wheelchair Mobility    Modified Rankin (Stroke Patients Only)       Balance                                             Cognition Arousal/Alertness: Awake/alert Behavior During Therapy: WFL for tasks assessed/performed Overall Cognitive Status: Within Functional Limits for tasks assessed                                        Exercises Total Joint Exercises Ankle Circles/Pumps: AROM;Both;15 reps;Supine Quad Sets: AROM;Both;10 reps;Supine Heel Slides: AAROM;Right;15 reps;Supine Straight Leg Raises: AAROM;AROM;Right;15 reps;Supine Goniometric ROM: AAROM at R knee -10 - 95    General Comments        Pertinent Vitals/Pain Pain Assessment: 0-10 Pain Score: 4  Pain Location: R knee Pain Descriptors / Indicators: Aching;Sore Pain Intervention(s): Limited activity within patient's tolerance;Monitored during session;Premedicated before session;Ice applied    Home Living                      Prior Function            PT Goals (current goals can now be found in the care plan section) Acute Rehab PT Goals Patient Stated Goal: Regain IND in 10 weeks to care for grandson PT Goal Formulation: With patient Time For Goal Achievement: 11/29/16 Potential to Achieve Goals: Good Progress towards PT goals: Progressing toward goals    Frequency    7X/week      PT Plan Current plan remains appropriate  Co-evaluation              AM-PAC PT "6 Clicks" Daily Activity  Outcome Measure  Difficulty turning over in bed (including adjusting bedclothes, sheets and blankets)?: A Lot Difficulty moving from lying on back to sitting on the side of the bed? : A Little Difficulty sitting down on and standing up from a chair with arms (e.g., wheelchair, bedside commode, etc,.)?: A Little Help needed moving to and from a bed to chair (including a wheelchair)?: A Little Help needed walking in hospital room?: A Little Help needed climbing 3-5 steps with a railing? : A Little 6 Click Score: 17    End of Session Equipment Utilized During Treatment: Gait belt Activity Tolerance:  Patient tolerated treatment well Patient left: in chair;with call bell/phone within reach;with family/visitor present Nurse Communication: Mobility status PT Visit Diagnosis: Difficulty in walking, not elsewhere classified (R26.2)     Time: 1624-4695 PT Time Calculation (min) (ACUTE ONLY): 34 min  Charges:  $Gait Training: 8-22 mins $Therapeutic Exercise: 8-22 mins                    G Codes:       Pg [phone]    Mashelle Busick 11/25/2016, 1:02 PM

## 2016-11-25 NOTE — Progress Notes (Signed)
Discharged from floor via w/c for transport home by car. Spouse & belongings with pt. No changes in assessment. Kayla Waters  

## 2016-11-25 NOTE — Progress Notes (Signed)
    Patient evaluated early this am and doing well PO Day 1 S/P R TKA per Dr Berenice Primas team. She reports pain is well controlled with meds and ice. She slept very well last night and has tolerated CPM without difficulty. She is eating and drinking well and + passing gas and urination. She is pending supervised trial of ASA. Hx of rx with hives however unsure if was from ASA or another medication. ASA was provided this morning and pt did not yet report any adverse reaction.    Physical Exam: Vitals:   11/25/16 0615 11/25/16 0950  BP: (!) 104/51 (!) 104/52  Pulse: 64 69  Resp: 16 18  Temp: 98.3 F (36.8 C) 98.7 F (37.1 C)  SpO2: 96% 96%   CBC Latest Ref Rng & Units 11/25/2016 11/22/2016 01/05/2010  WBC 4.0 - 10.5 K/uL 10.4 6.2 8.3  Hemoglobin 12.0 - 15.0 g/dL 11.5(L) 14.6 10.8(L)  Hematocrit 36.0 - 46.0 % 34.0(L) 43.4 31.5(L)  Platelets 150 - 400 K/uL 276 384 80    Dressing in place, CDI, no prom ecchy, distal compartments soft and non-tender BIL, SCD on contralateral side, knee immob in place, CPM at bedside, pt sitting up and eating comfortably NVI  POD #1 s/p R TKR per Dr Berenice Primas team doing well  - [name] looks wonderful this morning, excellent pain control and progressing well  - up with PT/OT, encourage ambulation  -If clear PT plan to D/C home today under husbands care  -Virtual PT and CPM set up for home  - Percocet for pain, Robaxin for muscle spasms  -Scripts printed and signed for D/C in chart - ASA 325 BID X 4 weeks if passes trial  -If unable to tolerate ASA will utilize Elequis 2.5 BID x 3 weeks  -ASA 325 BID x 4 weeks faxed to pharmacy, cx Elequis if tolerating ASA - likely d/c home today - F/U with Dr Berenice Primas in office at regular scheduled appt

## 2016-11-25 NOTE — Care Management CC44 (Signed)
Condition Code 44 Documentation Completed  Patient Details  Name: ALIANIS TRIMMER MRN: [id number] Date of Birth: 1945-05-06   Condition Code 44 given:  Yes Patient signature on Condition Code 44 notice:  Yes Documentation of 2 MD's agreement:  Yes Code 44 added to claim:  Yes    Erenest Rasher, RN 11/25/2016, 3:48 PM

## 2016-11-25 NOTE — Care Management Note (Signed)
Case Management Note  Patient Details  Name: Kayla Waters MRN: [id number] Date of Birth: [birth year]  Subjective/Objective:     Right TKA               Action/Plan: Discharge Planning: NCM spoke to pt and she has [facility] (virtual in home rehab program) set up. She has a RW at home. Husband will be at home to assist with her care.   PCP LE, THAO P  Expected Discharge Date:  11/25/16               Expected Discharge Plan:  Home/Self Care  In-House Referral:  NA  Discharge planning Services  CM Consult  Post Acute Care Choice:  NA Choice offered to:  NA  DME Arranged:  N/A DME Agency:  NA  HH Arranged:  NA HH Agency:  NA  Status of Service:  Completed, signed off  If discussed at [place] of Stay Meetings, dates discussed:    Additional Comments:  Erenest Rasher, RN 11/25/2016, 9:20 AM

## 2016-11-27 ENCOUNTER — Encounter (HOSPITAL_COMMUNITY): Payer: Self-pay | Admitting: Orthopedic Surgery

## 2016-11-30 NOTE — Progress Notes (Signed)
   11/24/16 1800  PT Time Calculation  PT Start Time (ACUTE ONLY) 1620  PT Stop Time (ACUTE ONLY) 1655  PT Time Calculation (min) (ACUTE ONLY) 35 min  PT G-Codes **NOT FOR INPATIENT CLASS**  Functional Assessment Tool Used Clinical judgement  Functional Limitation Mobility: Walking and moving around  Mobility: Walking and Moving Around Current Status (N4627) CK  Mobility: Walking and Moving Around Goal Status (O3500) CI  PT General Charges  $$ ACUTE PT VISIT 1 Visit  PT Evaluation  $PT Eval Low Complexity 1 Low  PT Treatments  $Gait Training 8-22 mins

## 2016-11-30 NOTE — Discharge Summary (Signed)
Patient ID: Kayla Waters MRN: [id number] DOB/AGE: 1946-01-25 71 y.o.  Admit date: 11/24/2016 Discharge date: 11/25/2016  Admission Diagnoses:  Principal Problem:   Primary osteoarthritis of right knee   Discharge Diagnoses:  Same  Past Medical History:  Diagnosis Date  . Depression   . Dyslipidemia   . Elevated liver function tests   . Osteoarthritis   . Osteopenia   . PONV (postoperative nausea and vomiting)    evening  post Left TKA ; patient reports having severe nausea and vomiting and was given phenergan after which she states " i stopped breathing and my husband had to call repiratory therapy and they had head-tilt, chin thrust me " . patient unaware if event caused by anesthesia or phenergan . denies having phenergan since that time   . Radius fracture    left     Surgeries: Procedure(s): RIGHT TOTAL KNEE ARTHROPLASTY on 11/24/2016   Consultants:  None  Discharged Condition: Improved  Hospital Course: Kayla Waters is an 71 y.o. female who was admitted 11/24/2016 for operative treatment of Primary osteoarthritis of right knee. Patient has severe unremitting pain that affects sleep, daily activities, and work/hobbies. After pre-op clearance the patient was taken to the operating room on 11/24/2016 and underwent  Procedure(s): RIGHT TOTAL KNEE ARTHROPLASTY.    Patient was given perioperative antibiotics:  Anti-infectives    Start     Dose/Rate Route Frequency Ordered Stop   11/24/16 1430  ceFAZolin (ANCEF) IVPB 2g/100 mL premix     2 g 200 mL/hr over 30 Minutes Intravenous Every 6 hours 11/24/16 1051 11/24/16 2102   11/24/16 0832  ceFAZolin (ANCEF) 2-4 GM/100ML-% IVPB    Comments:  Danley Danker   : cabinet override      11/24/16 0832 11/24/16 0832   11/24/16 0715  cefUROXime (ZINACEF) 1.5 g in dextrose 5 % 50 mL IVPB     1.5 g 100 mL/hr over 30 Minutes Intravenous On call to O.R. 11/24/16 0702 11/24/16 0734   11/24/16 0700  cefUROXime (ZINACEF)  injection 1.5 g  Status:  Discontinued     1.5 g Intravenous On call to O.R. 11/24/16 0659 11/24/16 0701   11/24/16 0648  ceFAZolin (ANCEF) 2-4 GM/100ML-% IVPB    Comments:  Carleene Cooper   : cabinet override      11/24/16 0648 11/24/16 1859   11/24/16 0525  ceFAZolin (ANCEF) IVPB 2g/100 mL premix     2 g 200 mL/hr over 30 Minutes Intravenous On call to O.R. 11/24/16 8032 11/24/16 1224       Patient was given sequential compression devices, early ambulation to prevent DVT.  Patient benefited maximally from hospital stay and there were no complications.    Recent vital signs: BP 112/63   Pulse 65   Temp 98.7 F (37.1 C)   Resp 18   Ht 5\' 9"  (1.753 m)   Wt 81.8 kg (180 lb 6.4 oz)   SpO2 96%   BMI 26.64 kg/m    Discharge Medications:   Allergies as of 11/25/2016      Reactions   Aspirin Hives   Morphine And Related Itching   Penicillins Hives   Sulfa Antibiotics Other (See Comments)   Fixed drug eruption    Macrodantin [nitrofurantoin] Rash      Medication List    TAKE these medications   aspirin 325 MG tablet Take 1 tablet (325 mg total) by mouth 2 (two) times daily.   CALCIUM-MAGNESIUM PO Take 2 tablets  by mouth daily.   docusate sodium 100 MG capsule Commonly known as:  COLACE Take 1 capsule (100 mg total) by mouth 2 (two) times daily.   Magnesium 100 MG Caps Take 100 mg by mouth daily.   oxyCODONE-acetaminophen 5-325 MG tablet Commonly known as:  PERCOCET/ROXICET Take 1-2 tablets by mouth every 6 (six) hours as needed for severe pain.   tiZANidine 2 MG tablet Commonly known as:  ZANAFLEX Take 1 tablet (2 mg total) by mouth every 8 (eight) hours as needed for muscle spasms.   TURMERIC PO Take 2 capsules by mouth daily.   VIIBRYD 10 MG Tabs Generic drug:  Vilazodone HCl Take 10 mg by mouth See admin instructions. Take 10 mg by mouth 6 times weekly   VITAMIN D3 PO Take 1 capsule by mouth daily.       Diagnostic Studies: No results  found.  Disposition: 01-Home or Self Care  Discharge Instructions    Discharge patient    Complete by:  As directed    Pending progress with PT/OT, if cleared OK to D/C home ASA 325 BID faxed to pharmacy for anti-coagulation, if pt tolerates trial without adverse reaction no need for elequis script.  Pain meds printed and signed in paper chart for D/C F/U in office as scheduled with [facility]   Discharge disposition:  01-Home or Self Care   Discharge patient date:  11/25/2016     POD #1 s/p R TKR per Dr Berenice Primas team doing well  - [name] looks wonderful this morning, excellent pain control and progressing well  - up with PT/OT, encourage ambulation             -If clear PT plan to D/C home today under husbands care             -Virtual PT and CPM set up for home  - Percocet for pain, Robaxin for muscle spasms             -Scripts printed and signed for D/C in chart - ASA 325 BID X 4 weeks if passes trial             -If unable to tolerate ASA will utilize Elequis 2.5 BID x 3 weeks             -ASA 325 BID x 4 weeks faxed to pharmacy, cx Elequis if tolerating ASA - F/U with Dr Berenice Primas in office at regular scheduled appt  -Written scripts for pain signed and in chart -D/C instructions sheet printed and in chart -D/C today   Signed: Justice Britain 11/30/2016, 11:48 AM

## 2016-12-11 DIAGNOSIS — Z9889 Other specified postprocedural states: Secondary | ICD-10-CM | POA: Diagnosis not present

## 2016-12-11 DIAGNOSIS — M1711 Unilateral primary osteoarthritis, right knee: Secondary | ICD-10-CM | POA: Diagnosis not present

## 2016-12-27 DIAGNOSIS — Z23 Encounter for immunization: Secondary | ICD-10-CM | POA: Diagnosis not present

## 2016-12-27 DIAGNOSIS — G4709 Other insomnia: Secondary | ICD-10-CM | POA: Diagnosis not present

## 2016-12-28 DIAGNOSIS — M1711 Unilateral primary osteoarthritis, right knee: Secondary | ICD-10-CM | POA: Diagnosis not present

## 2017-02-09 DIAGNOSIS — M1711 Unilateral primary osteoarthritis, right knee: Secondary | ICD-10-CM | POA: Diagnosis not present

## 2017-02-09 DIAGNOSIS — Z9889 Other specified postprocedural states: Secondary | ICD-10-CM | POA: Diagnosis not present

## 2017-08-08 ENCOUNTER — Other Ambulatory Visit: Payer: Self-pay | Admitting: Family Medicine

## 2017-08-08 DIAGNOSIS — Z1231 Encounter for screening mammogram for malignant neoplasm of breast: Secondary | ICD-10-CM

## 2017-08-09 DIAGNOSIS — M545 Low back pain: Secondary | ICD-10-CM | POA: Diagnosis not present

## 2017-08-14 DIAGNOSIS — M545 Low back pain: Secondary | ICD-10-CM | POA: Diagnosis not present

## 2017-08-17 DIAGNOSIS — M545 Low back pain: Secondary | ICD-10-CM | POA: Diagnosis not present

## 2017-08-20 DIAGNOSIS — M545 Low back pain: Secondary | ICD-10-CM | POA: Diagnosis not present

## 2017-08-23 DIAGNOSIS — M545 Low back pain: Secondary | ICD-10-CM | POA: Diagnosis not present

## 2017-09-03 DIAGNOSIS — M545 Low back pain: Secondary | ICD-10-CM | POA: Diagnosis not present

## 2017-09-04 ENCOUNTER — Ambulatory Visit
Admission: RE | Admit: 2017-09-04 | Discharge: 2017-09-04 | Disposition: A | Discharge status: Home / Self Care | Payer: Medicare Other | Source: Ambulatory Visit | Attending: Family Medicine | Admitting: Family Medicine

## 2017-09-04 DIAGNOSIS — Z1231 Encounter for screening mammogram for malignant neoplasm of breast: Secondary | ICD-10-CM

## 2017-09-06 DIAGNOSIS — M545 Low back pain: Secondary | ICD-10-CM | POA: Diagnosis not present

## 2017-11-20 DIAGNOSIS — H903 Sensorineural hearing loss, bilateral: Secondary | ICD-10-CM | POA: Diagnosis not present

## 2017-12-14 DIAGNOSIS — H25013 Cortical age-related cataract, bilateral: Secondary | ICD-10-CM | POA: Diagnosis not present

## 2017-12-14 DIAGNOSIS — H35363 Drusen (degenerative) of macula, bilateral: Secondary | ICD-10-CM | POA: Diagnosis not present

## 2017-12-14 DIAGNOSIS — H35033 Hypertensive retinopathy, bilateral: Secondary | ICD-10-CM | POA: Diagnosis not present

## 2017-12-14 DIAGNOSIS — H2513 Age-related nuclear cataract, bilateral: Secondary | ICD-10-CM | POA: Diagnosis not present

## 2017-12-28 DIAGNOSIS — N343 Urethral syndrome, unspecified: Secondary | ICD-10-CM | POA: Diagnosis not present

## 2018-01-15 DIAGNOSIS — M25552 Pain in left hip: Secondary | ICD-10-CM | POA: Diagnosis not present

## 2018-01-15 DIAGNOSIS — M7062 Trochanteric bursitis, left hip: Secondary | ICD-10-CM | POA: Diagnosis not present

## 2018-01-15 DIAGNOSIS — Z96641 Presence of right artificial hip joint: Secondary | ICD-10-CM | POA: Diagnosis not present

## 2018-02-20 DIAGNOSIS — R3915 Urgency of urination: Secondary | ICD-10-CM | POA: Diagnosis not present

## 2018-03-04 DIAGNOSIS — M545 Low back pain: Secondary | ICD-10-CM | POA: Diagnosis not present

## 2018-03-04 DIAGNOSIS — M25552 Pain in left hip: Secondary | ICD-10-CM | POA: Diagnosis not present

## 2018-03-06 DIAGNOSIS — Z0000 Encounter for general adult medical examination without abnormal findings: Secondary | ICD-10-CM | POA: Diagnosis not present

## 2018-03-06 DIAGNOSIS — Z23 Encounter for immunization: Secondary | ICD-10-CM | POA: Diagnosis not present

## 2018-03-06 DIAGNOSIS — E559 Vitamin D deficiency, unspecified: Secondary | ICD-10-CM | POA: Diagnosis not present

## 2018-03-06 DIAGNOSIS — K7581 Nonalcoholic steatohepatitis (NASH): Secondary | ICD-10-CM | POA: Diagnosis not present

## 2018-03-06 DIAGNOSIS — Z13220 Encounter for screening for lipoid disorders: Secondary | ICD-10-CM | POA: Diagnosis not present

## 2018-03-06 DIAGNOSIS — M8580 Other specified disorders of bone density and structure, unspecified site: Secondary | ICD-10-CM | POA: Diagnosis not present

## 2018-03-06 DIAGNOSIS — R3915 Urgency of urination: Secondary | ICD-10-CM | POA: Diagnosis not present

## 2018-03-08 ENCOUNTER — Other Ambulatory Visit: Payer: Self-pay | Admitting: Family Medicine

## 2018-03-08 DIAGNOSIS — M8580 Other specified disorders of bone density and structure, unspecified site: Secondary | ICD-10-CM

## 2018-10-04 DIAGNOSIS — J3489 Other specified disorders of nose and nasal sinuses: Secondary | ICD-10-CM | POA: Diagnosis not present

## 2018-10-08 DIAGNOSIS — J329 Chronic sinusitis, unspecified: Secondary | ICD-10-CM | POA: Diagnosis not present

## 2018-10-08 DIAGNOSIS — M26629 Arthralgia of temporomandibular joint, unspecified side: Secondary | ICD-10-CM | POA: Diagnosis not present

## 2018-10-08 DIAGNOSIS — J324 Chronic pansinusitis: Secondary | ICD-10-CM | POA: Diagnosis not present

## 2018-10-08 DIAGNOSIS — J3489 Other specified disorders of nose and nasal sinuses: Secondary | ICD-10-CM | POA: Diagnosis not present

## 2018-10-08 DIAGNOSIS — Z8709 Personal history of other diseases of the respiratory system: Secondary | ICD-10-CM | POA: Diagnosis not present

## 2018-10-10 ENCOUNTER — Other Ambulatory Visit: Payer: Self-pay | Admitting: Family Medicine

## 2018-10-10 DIAGNOSIS — Z1231 Encounter for screening mammogram for malignant neoplasm of breast: Secondary | ICD-10-CM

## 2018-10-14 DIAGNOSIS — R109 Unspecified abdominal pain: Secondary | ICD-10-CM | POA: Diagnosis not present

## 2018-10-18 ENCOUNTER — Ambulatory Visit (HOSPITAL_BASED_OUTPATIENT_CLINIC_OR_DEPARTMENT_OTHER)
Admission: RE | Admit: 2018-10-18 | Discharge: 2018-10-18 | Disposition: A | Discharge status: Home / Self Care | Payer: Medicare Other | Source: Ambulatory Visit | Attending: Nurse Practitioner | Admitting: Nurse Practitioner

## 2018-10-18 ENCOUNTER — Other Ambulatory Visit (HOSPITAL_BASED_OUTPATIENT_CLINIC_OR_DEPARTMENT_OTHER): Payer: Self-pay | Admitting: Nurse Practitioner

## 2018-10-18 ENCOUNTER — Other Ambulatory Visit: Payer: Self-pay

## 2018-10-18 DIAGNOSIS — R140 Abdominal distension (gaseous): Secondary | ICD-10-CM

## 2018-10-18 DIAGNOSIS — R1110 Vomiting, unspecified: Secondary | ICD-10-CM | POA: Diagnosis not present

## 2018-10-18 DIAGNOSIS — R109 Unspecified abdominal pain: Secondary | ICD-10-CM | POA: Diagnosis not present

## 2018-10-18 DIAGNOSIS — K8020 Calculus of gallbladder without cholecystitis without obstruction: Secondary | ICD-10-CM | POA: Diagnosis not present

## 2018-10-18 DIAGNOSIS — R1084 Generalized abdominal pain: Secondary | ICD-10-CM | POA: Diagnosis not present

## 2018-10-18 MED ORDER — IOHEXOL 300 MG/ML  SOLN
100.0000 mL | Freq: Once | INTRAMUSCULAR | Status: AC | PRN
  Administered 2018-10-18: 17:00:00 100 mL via INTRAVENOUS

## 2018-10-22 DIAGNOSIS — J324 Chronic pansinusitis: Secondary | ICD-10-CM | POA: Diagnosis not present

## 2018-10-22 DIAGNOSIS — J329 Chronic sinusitis, unspecified: Secondary | ICD-10-CM | POA: Diagnosis not present

## 2018-10-24 DIAGNOSIS — R140 Abdominal distension (gaseous): Secondary | ICD-10-CM | POA: Diagnosis not present

## 2018-10-24 DIAGNOSIS — R1084 Generalized abdominal pain: Secondary | ICD-10-CM | POA: Diagnosis not present

## 2018-10-24 DIAGNOSIS — K8020 Calculus of gallbladder without cholecystitis without obstruction: Secondary | ICD-10-CM | POA: Diagnosis not present

## 2018-11-07 ENCOUNTER — Other Ambulatory Visit (HOSPITAL_COMMUNITY): Payer: Self-pay | Admitting: Surgery

## 2018-11-07 DIAGNOSIS — K829 Disease of gallbladder, unspecified: Secondary | ICD-10-CM | POA: Diagnosis not present

## 2018-11-18 ENCOUNTER — Other Ambulatory Visit: Payer: Self-pay | Admitting: Gastroenterology

## 2018-11-18 ENCOUNTER — Other Ambulatory Visit (HOSPITAL_COMMUNITY): Payer: Self-pay | Admitting: Gastroenterology

## 2018-11-18 DIAGNOSIS — R1084 Generalized abdominal pain: Secondary | ICD-10-CM

## 2018-11-22 ENCOUNTER — Ambulatory Visit: Payer: Medicare Other

## 2018-12-02 ENCOUNTER — Other Ambulatory Visit: Payer: Self-pay

## 2018-12-02 ENCOUNTER — Encounter (HOSPITAL_COMMUNITY)
Admission: RE | Admit: 2018-12-02 | Discharge: 2018-12-02 | Disposition: A | Discharge status: Home / Self Care | Payer: Medicare Other | Source: Ambulatory Visit | Attending: Gastroenterology | Admitting: Gastroenterology

## 2018-12-02 DIAGNOSIS — R1084 Generalized abdominal pain: Secondary | ICD-10-CM

## 2018-12-02 MED ORDER — TECHNETIUM TC 99M MEBROFENIN IV KIT
5.3700 | PACK | Freq: Once | INTRAVENOUS | Status: AC | PRN
  Administered 2018-12-02: 07:00:00 5.37 via INTRAVENOUS

## 2018-12-06 DIAGNOSIS — K8020 Calculus of gallbladder without cholecystitis without obstruction: Secondary | ICD-10-CM | POA: Diagnosis not present

## 2018-12-06 DIAGNOSIS — R1084 Generalized abdominal pain: Secondary | ICD-10-CM | POA: Diagnosis not present

## 2018-12-07 DIAGNOSIS — Z23 Encounter for immunization: Secondary | ICD-10-CM | POA: Diagnosis not present

## 2018-12-10 ENCOUNTER — Ambulatory Visit
Admission: RE | Admit: 2018-12-10 | Discharge: 2018-12-10 | Disposition: A | Discharge status: Home / Self Care | Payer: Medicare Other | Source: Ambulatory Visit | Attending: Family Medicine | Admitting: Family Medicine

## 2018-12-10 ENCOUNTER — Other Ambulatory Visit: Payer: Self-pay

## 2018-12-10 DIAGNOSIS — Z1231 Encounter for screening mammogram for malignant neoplasm of breast: Secondary | ICD-10-CM

## 2019-03-14 ENCOUNTER — Ambulatory Visit: Payer: Medicare Other

## 2019-03-17 DIAGNOSIS — E785 Hyperlipidemia, unspecified: Secondary | ICD-10-CM | POA: Diagnosis not present

## 2019-03-17 DIAGNOSIS — Z23 Encounter for immunization: Secondary | ICD-10-CM | POA: Diagnosis not present

## 2019-03-17 DIAGNOSIS — Z1211 Encounter for screening for malignant neoplasm of colon: Secondary | ICD-10-CM | POA: Diagnosis not present

## 2019-03-17 DIAGNOSIS — Z0000 Encounter for general adult medical examination without abnormal findings: Secondary | ICD-10-CM | POA: Diagnosis not present

## 2019-03-17 DIAGNOSIS — M8580 Other specified disorders of bone density and structure, unspecified site: Secondary | ICD-10-CM | POA: Diagnosis not present

## 2019-03-17 DIAGNOSIS — K8020 Calculus of gallbladder without cholecystitis without obstruction: Secondary | ICD-10-CM | POA: Diagnosis not present

## 2019-03-21 ENCOUNTER — Ambulatory Visit: Payer: Medicare Other | Attending: Internal Medicine

## 2019-03-21 DIAGNOSIS — Z23 Encounter for immunization: Secondary | ICD-10-CM | POA: Insufficient documentation

## 2019-03-21 NOTE — Progress Notes (Signed)
   Covid-19 Vaccination Clinic  Name:  Lonnette Bonadio    MRN: [id number] DOB: Jan 05, 1946  03/21/2019  Ms. Mcphail was observed post Covid-19 immunization for 15 minutes without incidence. She was provided with Vaccine Information Sheet and instruction to access the V-Safe system.   Ms. Mi was instructed to call 911 with any severe reactions post vaccine: Marland Kitchen Difficulty breathing  . Swelling of your face and throat  . A fast heartbeat  . A bad rash all over your body  . Dizziness and weakness    Immunizations Administered    Name Date Dose VIS Date Route   Pfizer COVID-19 Vaccine 03/21/2019  3:21 PM 0.3 mL 01/24/2019 Intramuscular   Manufacturer: [place]   Lot: [place]: [id number]

## 2019-03-28 ENCOUNTER — Ambulatory Visit
Admission: RE | Admit: 2019-03-28 | Discharge: 2019-03-28 | Disposition: A | Discharge status: Home / Self Care | Payer: Medicare Other | Source: Ambulatory Visit | Attending: Family Medicine | Admitting: Family Medicine

## 2019-03-28 ENCOUNTER — Other Ambulatory Visit: Payer: Self-pay

## 2019-03-28 DIAGNOSIS — M8589 Other specified disorders of bone density and structure, multiple sites: Secondary | ICD-10-CM | POA: Diagnosis not present

## 2019-03-28 DIAGNOSIS — Z780 Asymptomatic menopausal state: Secondary | ICD-10-CM | POA: Diagnosis not present

## 2019-03-28 DIAGNOSIS — M8580 Other specified disorders of bone density and structure, unspecified site: Secondary | ICD-10-CM

## 2019-04-04 ENCOUNTER — Ambulatory Visit: Payer: Medicare Other

## 2019-04-15 ENCOUNTER — Ambulatory Visit: Payer: Medicare Other | Attending: Internal Medicine

## 2019-04-15 DIAGNOSIS — Z23 Encounter for immunization: Secondary | ICD-10-CM | POA: Insufficient documentation

## 2019-04-15 NOTE — Progress Notes (Signed)
   Covid-19 Vaccination Clinic  Name:  Calani Starace    MRN: [id number] DOB: [birth year]  04/15/2019  Ms. Lockler was observed post Covid-19 immunization for 15 minutes without incident. She was provided with Vaccine Information Sheet and instruction to access the V-Safe system.   Ms. Smits was instructed to call 911 with any severe reactions post vaccine: Marland Kitchen Difficulty breathing  . Swelling of face and throat  . A fast heartbeat  . A bad rash all over body  . Dizziness and weakness   Immunizations Administered    Name Date Dose VIS Date Route   Pfizer COVID-19 Vaccine 04/15/2019  3:10 PM 0.3 mL 01/24/2019 Intramuscular   Manufacturer: [place]   Lot: [id number]   [place]: [id number]

## 2019-07-13 DIAGNOSIS — K828 Other specified diseases of gallbladder: Secondary | ICD-10-CM | POA: Diagnosis not present

## 2019-07-13 DIAGNOSIS — R109 Unspecified abdominal pain: Secondary | ICD-10-CM | POA: Diagnosis not present

## 2019-07-13 DIAGNOSIS — K8020 Calculus of gallbladder without cholecystitis without obstruction: Secondary | ICD-10-CM | POA: Diagnosis not present

## 2019-07-13 DIAGNOSIS — K8070 Calculus of gallbladder and bile duct without cholecystitis without obstruction: Secondary | ICD-10-CM | POA: Diagnosis not present

## 2019-07-16 DIAGNOSIS — K8020 Calculus of gallbladder without cholecystitis without obstruction: Secondary | ICD-10-CM | POA: Diagnosis not present

## 2019-07-30 DIAGNOSIS — Z01812 Encounter for preprocedural laboratory examination: Secondary | ICD-10-CM | POA: Diagnosis not present

## 2019-07-30 DIAGNOSIS — Z20828 Contact with and (suspected) exposure to other viral communicable diseases: Secondary | ICD-10-CM | POA: Diagnosis not present

## 2019-08-01 DIAGNOSIS — N736 Female pelvic peritoneal adhesions (postinfective): Secondary | ICD-10-CM | POA: Diagnosis not present

## 2019-08-01 DIAGNOSIS — Z96653 Presence of artificial knee joint, bilateral: Secondary | ICD-10-CM | POA: Diagnosis not present

## 2019-08-01 DIAGNOSIS — L905 Scar conditions and fibrosis of skin: Secondary | ICD-10-CM | POA: Diagnosis not present

## 2019-08-01 DIAGNOSIS — K8010 Calculus of gallbladder with chronic cholecystitis without obstruction: Secondary | ICD-10-CM | POA: Diagnosis not present

## 2019-08-01 DIAGNOSIS — K8020 Calculus of gallbladder without cholecystitis without obstruction: Secondary | ICD-10-CM | POA: Diagnosis not present

## 2019-08-01 DIAGNOSIS — K8070 Calculus of gallbladder and bile duct without cholecystitis without obstruction: Secondary | ICD-10-CM | POA: Diagnosis not present

## 2019-10-09 DIAGNOSIS — E785 Hyperlipidemia, unspecified: Secondary | ICD-10-CM | POA: Diagnosis not present

## 2019-10-09 DIAGNOSIS — Z1211 Encounter for screening for malignant neoplasm of colon: Secondary | ICD-10-CM | POA: Diagnosis not present

## 2019-10-09 DIAGNOSIS — Z23 Encounter for immunization: Secondary | ICD-10-CM | POA: Diagnosis not present

## 2019-10-10 DIAGNOSIS — Z1211 Encounter for screening for malignant neoplasm of colon: Secondary | ICD-10-CM | POA: Diagnosis not present

## 2019-11-21 DIAGNOSIS — Z20828 Contact with and (suspected) exposure to other viral communicable diseases: Secondary | ICD-10-CM | POA: Diagnosis not present

## 2019-12-26 DIAGNOSIS — Z23 Encounter for immunization: Secondary | ICD-10-CM | POA: Diagnosis not present

## 2020-02-26 DIAGNOSIS — U071 COVID-19: Secondary | ICD-10-CM | POA: Diagnosis not present

## 2020-02-26 DIAGNOSIS — Z20822 Contact with and (suspected) exposure to covid-19: Secondary | ICD-10-CM | POA: Diagnosis not present

## 2020-03-20 IMAGING — NM NM HEPATO W/GB/PHARM/[PERSON_NAME]
1 series · 6 of 6 positions shown · non-contrast
Comparison: CT 10/18/2018.  Ultrasound 08/01/2016.

CLINICAL DATA: Generalized abdominal pain

EXAM:
NUCLEAR MEDICINE HEPATOBILIARY IMAGING WITH GALLBLADDER EF
TECHNIQUE: Sequential images of the abdomen were obtained [DATE] minutes
following intravenous administration of radiopharmaceutical. After
oral ingestion of Ensure, gallbladder ejection fraction was
determined. At 60 min, normal ejection fraction is greater than 33%.
RADIOPHARMACEUTICALS:  5.3 mCi 6c-IIm  Choletec IV

[gallbladder report - (id) at 10_19_(id) [id number] a · 4.52mm/px · 6 of 60 frames shown]
[frame 6/60]
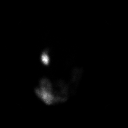
[frame 16/60]
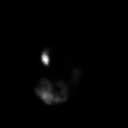
[frame 26/60]
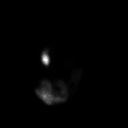
[frame 36/60]
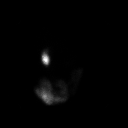
[frame 46/60]
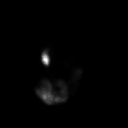
[frame 56/60]
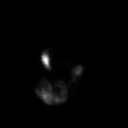

[6 of 6 positions shown; findings below may reference images not displayed]

FINDINGS: Prompt uptake and biliary excretion of activity by the liver is
seen. Gallbladder activity is visualized, consistent with patency of
cystic duct. Biliary activity passes into small bowel, consistent
with patent common bile duct.

Calculated gallbladder ejection fraction is 64%. (Normal gallbladder
ejection fraction with Ensure is greater than 33%.)
IMPRESSION: Normal study.

## 2020-04-20 DIAGNOSIS — H903 Sensorineural hearing loss, bilateral: Secondary | ICD-10-CM | POA: Diagnosis not present

## 2020-04-23 DIAGNOSIS — K219 Gastro-esophageal reflux disease without esophagitis: Secondary | ICD-10-CM | POA: Diagnosis not present

## 2020-04-23 DIAGNOSIS — M5412 Radiculopathy, cervical region: Secondary | ICD-10-CM | POA: Diagnosis not present

## 2020-04-23 DIAGNOSIS — R52 Pain, unspecified: Secondary | ICD-10-CM | POA: Diagnosis not present

## 2020-04-29 DIAGNOSIS — Z01812 Encounter for preprocedural laboratory examination: Secondary | ICD-10-CM | POA: Diagnosis not present

## 2020-05-15 DIAGNOSIS — M542 Cervicalgia: Secondary | ICD-10-CM | POA: Diagnosis not present

## 2020-05-20 DIAGNOSIS — M47812 Spondylosis without myelopathy or radiculopathy, cervical region: Secondary | ICD-10-CM | POA: Diagnosis not present

## 2020-06-11 ENCOUNTER — Other Ambulatory Visit: Payer: Self-pay | Admitting: Nurse Practitioner

## 2020-06-11 DIAGNOSIS — Z1231 Encounter for screening mammogram for malignant neoplasm of breast: Secondary | ICD-10-CM

## 2020-07-06 DIAGNOSIS — L821 Other seborrheic keratosis: Secondary | ICD-10-CM | POA: Diagnosis not present

## 2020-07-06 DIAGNOSIS — L570 Actinic keratosis: Secondary | ICD-10-CM | POA: Diagnosis not present

## 2020-08-11 ENCOUNTER — Ambulatory Visit
Admission: RE | Admit: 2020-08-11 | Discharge: 2020-08-11 | Disposition: A | Discharge status: Home / Self Care | Payer: Medicare Other | Source: Ambulatory Visit | Attending: Nurse Practitioner | Admitting: Nurse Practitioner

## 2020-08-11 ENCOUNTER — Other Ambulatory Visit: Payer: Self-pay

## 2020-08-11 DIAGNOSIS — Z1231 Encounter for screening mammogram for malignant neoplasm of breast: Secondary | ICD-10-CM | POA: Diagnosis not present

## 2020-08-25 DIAGNOSIS — R1084 Generalized abdominal pain: Secondary | ICD-10-CM | POA: Diagnosis not present

## 2020-08-25 DIAGNOSIS — K9049 Malabsorption due to intolerance, not elsewhere classified: Secondary | ICD-10-CM | POA: Diagnosis not present

## 2020-08-25 DIAGNOSIS — R634 Abnormal weight loss: Secondary | ICD-10-CM | POA: Diagnosis not present

## 2020-08-26 DIAGNOSIS — Z0000 Encounter for general adult medical examination without abnormal findings: Secondary | ICD-10-CM | POA: Diagnosis not present

## 2020-08-26 DIAGNOSIS — Z1389 Encounter for screening for other disorder: Secondary | ICD-10-CM | POA: Diagnosis not present

## 2020-08-26 DIAGNOSIS — E7800 Pure hypercholesterolemia, unspecified: Secondary | ICD-10-CM | POA: Diagnosis not present

## 2020-08-26 DIAGNOSIS — M8580 Other specified disorders of bone density and structure, unspecified site: Secondary | ICD-10-CM | POA: Diagnosis not present

## 2020-08-26 DIAGNOSIS — I700 Atherosclerosis of aorta: Secondary | ICD-10-CM | POA: Diagnosis not present

## 2020-09-22 DIAGNOSIS — K2970 Gastritis, unspecified, without bleeding: Secondary | ICD-10-CM | POA: Diagnosis not present

## 2020-09-22 DIAGNOSIS — R1013 Epigastric pain: Secondary | ICD-10-CM | POA: Diagnosis not present

## 2020-09-22 DIAGNOSIS — K222 Esophageal obstruction: Secondary | ICD-10-CM | POA: Diagnosis not present

## 2020-09-22 DIAGNOSIS — K869 Disease of pancreas, unspecified: Secondary | ICD-10-CM | POA: Diagnosis not present

## 2020-09-22 DIAGNOSIS — K449 Diaphragmatic hernia without obstruction or gangrene: Secondary | ICD-10-CM | POA: Diagnosis not present

## 2020-09-22 DIAGNOSIS — Z9049 Acquired absence of other specified parts of digestive tract: Secondary | ICD-10-CM | POA: Diagnosis not present

## 2020-09-23 ENCOUNTER — Other Ambulatory Visit: Payer: Self-pay | Admitting: Gastroenterology

## 2020-09-23 DIAGNOSIS — K869 Disease of pancreas, unspecified: Secondary | ICD-10-CM

## 2020-10-13 ENCOUNTER — Other Ambulatory Visit: Payer: Self-pay

## 2020-10-13 ENCOUNTER — Ambulatory Visit
Admission: RE | Admit: 2020-10-13 | Discharge: 2020-10-13 | Disposition: A | Discharge status: Home / Self Care | Payer: Medicare Other | Source: Ambulatory Visit | Attending: Gastroenterology | Admitting: Gastroenterology

## 2020-10-13 DIAGNOSIS — K869 Disease of pancreas, unspecified: Secondary | ICD-10-CM | POA: Diagnosis not present

## 2020-10-13 DIAGNOSIS — Z9049 Acquired absence of other specified parts of digestive tract: Secondary | ICD-10-CM | POA: Diagnosis not present

## 2020-10-13 MED ORDER — GADOBENATE DIMEGLUMINE 529 MG/ML IV SOLN
16.0000 mL | Freq: Once | INTRAVENOUS | Status: AC | PRN
  Administered 2020-10-13: 16 mL via INTRAVENOUS

## 2020-10-14 DIAGNOSIS — H2513 Age-related nuclear cataract, bilateral: Secondary | ICD-10-CM | POA: Diagnosis not present

## 2020-10-14 DIAGNOSIS — H353131 Nonexudative age-related macular degeneration, bilateral, early dry stage: Secondary | ICD-10-CM | POA: Diagnosis not present

## 2020-10-14 DIAGNOSIS — H25013 Cortical age-related cataract, bilateral: Secondary | ICD-10-CM | POA: Diagnosis not present

## 2020-10-15 DIAGNOSIS — L821 Other seborrheic keratosis: Secondary | ICD-10-CM | POA: Diagnosis not present

## 2020-10-15 DIAGNOSIS — L814 Other melanin hyperpigmentation: Secondary | ICD-10-CM | POA: Diagnosis not present

## 2020-10-15 DIAGNOSIS — L570 Actinic keratosis: Secondary | ICD-10-CM | POA: Diagnosis not present

## 2020-10-15 DIAGNOSIS — D1801 Hemangioma of skin and subcutaneous tissue: Secondary | ICD-10-CM | POA: Diagnosis not present

## 2020-10-29 DIAGNOSIS — R1013 Epigastric pain: Secondary | ICD-10-CM | POA: Diagnosis not present

## 2020-10-29 DIAGNOSIS — K869 Disease of pancreas, unspecified: Secondary | ICD-10-CM | POA: Diagnosis not present

## 2020-10-29 DIAGNOSIS — K2970 Gastritis, unspecified, without bleeding: Secondary | ICD-10-CM | POA: Diagnosis not present

## 2020-11-04 DIAGNOSIS — H5319 Other subjective visual disturbances: Secondary | ICD-10-CM | POA: Diagnosis not present

## 2020-11-04 DIAGNOSIS — H25013 Cortical age-related cataract, bilateral: Secondary | ICD-10-CM | POA: Diagnosis not present

## 2020-11-04 DIAGNOSIS — H353121 Nonexudative age-related macular degeneration, left eye, early dry stage: Secondary | ICD-10-CM | POA: Diagnosis not present

## 2020-11-04 DIAGNOSIS — H2513 Age-related nuclear cataract, bilateral: Secondary | ICD-10-CM | POA: Diagnosis not present

## 2020-11-05 DIAGNOSIS — Z23 Encounter for immunization: Secondary | ICD-10-CM | POA: Diagnosis not present

## 2020-11-23 DIAGNOSIS — H43813 Vitreous degeneration, bilateral: Secondary | ICD-10-CM | POA: Diagnosis not present

## 2020-11-23 DIAGNOSIS — H353132 Nonexudative age-related macular degeneration, bilateral, intermediate dry stage: Secondary | ICD-10-CM | POA: Diagnosis not present

## 2020-11-23 DIAGNOSIS — H2513 Age-related nuclear cataract, bilateral: Secondary | ICD-10-CM | POA: Diagnosis not present

## 2021-01-24 DIAGNOSIS — H43813 Vitreous degeneration, bilateral: Secondary | ICD-10-CM | POA: Diagnosis not present

## 2021-01-24 DIAGNOSIS — H2513 Age-related nuclear cataract, bilateral: Secondary | ICD-10-CM | POA: Diagnosis not present

## 2021-01-24 DIAGNOSIS — H353132 Nonexudative age-related macular degeneration, bilateral, intermediate dry stage: Secondary | ICD-10-CM | POA: Diagnosis not present

## 2021-02-04 DIAGNOSIS — H903 Sensorineural hearing loss, bilateral: Secondary | ICD-10-CM | POA: Diagnosis not present

## 2021-02-04 DIAGNOSIS — H9113 Presbycusis, bilateral: Secondary | ICD-10-CM | POA: Diagnosis not present

## 2021-02-04 DIAGNOSIS — H9313 Tinnitus, bilateral: Secondary | ICD-10-CM | POA: Diagnosis not present

## 2021-06-20 DIAGNOSIS — H353132 Nonexudative age-related macular degeneration, bilateral, intermediate dry stage: Secondary | ICD-10-CM | POA: Diagnosis not present

## 2021-06-20 DIAGNOSIS — H2513 Age-related nuclear cataract, bilateral: Secondary | ICD-10-CM | POA: Diagnosis not present

## 2021-06-20 DIAGNOSIS — H43813 Vitreous degeneration, bilateral: Secondary | ICD-10-CM | POA: Diagnosis not present

## 2021-09-15 ENCOUNTER — Other Ambulatory Visit: Payer: Self-pay | Admitting: Nurse Practitioner

## 2021-09-15 ENCOUNTER — Other Ambulatory Visit: Payer: Self-pay | Admitting: Family Medicine

## 2021-09-15 DIAGNOSIS — Z1231 Encounter for screening mammogram for malignant neoplasm of breast: Secondary | ICD-10-CM

## 2021-12-03 DIAGNOSIS — Z23 Encounter for immunization: Secondary | ICD-10-CM | POA: Diagnosis not present

## 2021-12-05 ENCOUNTER — Ambulatory Visit
Admission: RE | Admit: 2021-12-05 | Discharge: 2021-12-05 | Disposition: A | Discharge status: Home / Self Care | Payer: Medicare Other | Source: Ambulatory Visit | Attending: Family Medicine | Admitting: Family Medicine

## 2021-12-05 DIAGNOSIS — Z1231 Encounter for screening mammogram for malignant neoplasm of breast: Secondary | ICD-10-CM

## 2021-12-19 DIAGNOSIS — H353132 Nonexudative age-related macular degeneration, bilateral, intermediate dry stage: Secondary | ICD-10-CM | POA: Diagnosis not present

## 2021-12-19 DIAGNOSIS — H43813 Vitreous degeneration, bilateral: Secondary | ICD-10-CM | POA: Diagnosis not present

## 2021-12-19 DIAGNOSIS — H2513 Age-related nuclear cataract, bilateral: Secondary | ICD-10-CM | POA: Diagnosis not present

## 2022-01-03 DIAGNOSIS — M542 Cervicalgia: Secondary | ICD-10-CM | POA: Diagnosis not present

## 2022-01-11 DIAGNOSIS — Z23 Encounter for immunization: Secondary | ICD-10-CM | POA: Diagnosis not present

## 2022-01-11 DIAGNOSIS — H3530 Unspecified macular degeneration: Secondary | ICD-10-CM | POA: Diagnosis not present

## 2022-01-11 DIAGNOSIS — Z13820 Encounter for screening for osteoporosis: Secondary | ICD-10-CM | POA: Diagnosis not present

## 2022-01-11 DIAGNOSIS — E785 Hyperlipidemia, unspecified: Secondary | ICD-10-CM | POA: Diagnosis not present

## 2022-01-11 DIAGNOSIS — Z7185 Encounter for immunization safety counseling: Secondary | ICD-10-CM | POA: Diagnosis not present

## 2022-01-11 DIAGNOSIS — Z0000 Encounter for general adult medical examination without abnormal findings: Secondary | ICD-10-CM | POA: Diagnosis not present

## 2022-01-11 DIAGNOSIS — Z1211 Encounter for screening for malignant neoplasm of colon: Secondary | ICD-10-CM | POA: Diagnosis not present

## 2022-01-11 DIAGNOSIS — M5030 Other cervical disc degeneration, unspecified cervical region: Secondary | ICD-10-CM | POA: Diagnosis not present

## 2022-01-11 DIAGNOSIS — H9190 Unspecified hearing loss, unspecified ear: Secondary | ICD-10-CM | POA: Diagnosis not present

## 2022-01-11 DIAGNOSIS — H9319 Tinnitus, unspecified ear: Secondary | ICD-10-CM | POA: Diagnosis not present

## 2022-01-11 DIAGNOSIS — M8580 Other specified disorders of bone density and structure, unspecified site: Secondary | ICD-10-CM | POA: Diagnosis not present

## 2022-01-12 ENCOUNTER — Other Ambulatory Visit: Payer: Self-pay | Admitting: Family Medicine

## 2022-01-12 DIAGNOSIS — Z13820 Encounter for screening for osteoporosis: Secondary | ICD-10-CM

## 2022-01-18 ENCOUNTER — Other Ambulatory Visit: Payer: Self-pay | Admitting: Family Medicine

## 2022-01-18 DIAGNOSIS — Z13820 Encounter for screening for osteoporosis: Secondary | ICD-10-CM

## 2022-01-31 DIAGNOSIS — M47812 Spondylosis without myelopathy or radiculopathy, cervical region: Secondary | ICD-10-CM | POA: Diagnosis not present

## 2022-03-02 DIAGNOSIS — H353121 Nonexudative age-related macular degeneration, left eye, early dry stage: Secondary | ICD-10-CM | POA: Diagnosis not present

## 2022-03-02 DIAGNOSIS — H353112 Nonexudative age-related macular degeneration, right eye, intermediate dry stage: Secondary | ICD-10-CM | POA: Diagnosis not present

## 2022-03-02 DIAGNOSIS — H43812 Vitreous degeneration, left eye: Secondary | ICD-10-CM | POA: Diagnosis not present

## 2022-03-02 DIAGNOSIS — H25813 Combined forms of age-related cataract, bilateral: Secondary | ICD-10-CM | POA: Diagnosis not present

## 2022-03-15 DIAGNOSIS — H35363 Drusen (degenerative) of macula, bilateral: Secondary | ICD-10-CM | POA: Diagnosis not present

## 2022-03-15 DIAGNOSIS — H353121 Nonexudative age-related macular degeneration, left eye, early dry stage: Secondary | ICD-10-CM | POA: Diagnosis not present

## 2022-03-15 DIAGNOSIS — H35453 Secondary pigmentary degeneration, bilateral: Secondary | ICD-10-CM | POA: Diagnosis not present

## 2022-03-15 DIAGNOSIS — H5315 Visual distortions of shape and size: Secondary | ICD-10-CM | POA: Diagnosis not present

## 2022-03-15 DIAGNOSIS — H35721 Serous detachment of retinal pigment epithelium, right eye: Secondary | ICD-10-CM | POA: Diagnosis not present

## 2022-03-15 DIAGNOSIS — H353211 Exudative age-related macular degeneration, right eye, with active choroidal neovascularization: Secondary | ICD-10-CM | POA: Diagnosis not present

## 2022-03-20 DIAGNOSIS — H903 Sensorineural hearing loss, bilateral: Secondary | ICD-10-CM | POA: Diagnosis not present

## 2022-03-21 DIAGNOSIS — M47812 Spondylosis without myelopathy or radiculopathy, cervical region: Secondary | ICD-10-CM | POA: Diagnosis not present

## 2022-03-22 ENCOUNTER — Other Ambulatory Visit: Payer: Self-pay | Admitting: Family Medicine

## 2022-03-22 DIAGNOSIS — Z780 Asymptomatic menopausal state: Secondary | ICD-10-CM

## 2022-03-29 DIAGNOSIS — H353211 Exudative age-related macular degeneration, right eye, with active choroidal neovascularization: Secondary | ICD-10-CM | POA: Diagnosis not present

## 2022-04-01 DIAGNOSIS — J22 Unspecified acute lower respiratory infection: Secondary | ICD-10-CM | POA: Diagnosis not present

## 2022-04-05 ENCOUNTER — Ambulatory Visit (INDEPENDENT_AMBULATORY_CARE_PROVIDER_SITE_OTHER): Payer: Medicare Other

## 2022-04-05 DIAGNOSIS — Z780 Asymptomatic menopausal state: Secondary | ICD-10-CM | POA: Diagnosis not present

## 2022-04-05 DIAGNOSIS — M8589 Other specified disorders of bone density and structure, multiple sites: Secondary | ICD-10-CM | POA: Diagnosis not present

## 2022-04-08 DIAGNOSIS — J40 Bronchitis, not specified as acute or chronic: Secondary | ICD-10-CM | POA: Diagnosis not present

## 2022-04-08 DIAGNOSIS — J329 Chronic sinusitis, unspecified: Secondary | ICD-10-CM | POA: Diagnosis not present

## 2022-04-08 DIAGNOSIS — R059 Cough, unspecified: Secondary | ICD-10-CM | POA: Diagnosis not present

## 2022-04-12 DIAGNOSIS — M47812 Spondylosis without myelopathy or radiculopathy, cervical region: Secondary | ICD-10-CM | POA: Diagnosis not present

## 2022-04-14 DIAGNOSIS — H353211 Exudative age-related macular degeneration, right eye, with active choroidal neovascularization: Secondary | ICD-10-CM | POA: Diagnosis not present

## 2022-04-14 DIAGNOSIS — M47812 Spondylosis without myelopathy or radiculopathy, cervical region: Secondary | ICD-10-CM | POA: Diagnosis not present

## 2022-04-14 DIAGNOSIS — H53142 Visual discomfort, left eye: Secondary | ICD-10-CM | POA: Diagnosis not present

## 2022-04-14 DIAGNOSIS — H10012 Acute follicular conjunctivitis, left eye: Secondary | ICD-10-CM | POA: Diagnosis not present

## 2022-04-14 DIAGNOSIS — H5712 Ocular pain, left eye: Secondary | ICD-10-CM | POA: Diagnosis not present

## 2022-04-14 DIAGNOSIS — H35363 Drusen (degenerative) of macula, bilateral: Secondary | ICD-10-CM | POA: Diagnosis not present

## 2022-04-17 DIAGNOSIS — M47812 Spondylosis without myelopathy or radiculopathy, cervical region: Secondary | ICD-10-CM | POA: Diagnosis not present

## 2022-04-20 DIAGNOSIS — M47812 Spondylosis without myelopathy or radiculopathy, cervical region: Secondary | ICD-10-CM | POA: Diagnosis not present

## 2022-04-24 DIAGNOSIS — M47812 Spondylosis without myelopathy or radiculopathy, cervical region: Secondary | ICD-10-CM | POA: Diagnosis not present

## 2022-04-27 DIAGNOSIS — M47812 Spondylosis without myelopathy or radiculopathy, cervical region: Secondary | ICD-10-CM | POA: Diagnosis not present

## 2022-04-28 DIAGNOSIS — H353211 Exudative age-related macular degeneration, right eye, with active choroidal neovascularization: Secondary | ICD-10-CM | POA: Diagnosis not present

## 2022-05-05 DIAGNOSIS — M47812 Spondylosis without myelopathy or radiculopathy, cervical region: Secondary | ICD-10-CM | POA: Diagnosis not present

## 2022-05-08 DIAGNOSIS — M47812 Spondylosis without myelopathy or radiculopathy, cervical region: Secondary | ICD-10-CM | POA: Diagnosis not present

## 2022-05-29 DIAGNOSIS — H353211 Exudative age-related macular degeneration, right eye, with active choroidal neovascularization: Secondary | ICD-10-CM | POA: Diagnosis not present

## 2022-07-26 DIAGNOSIS — H43813 Vitreous degeneration, bilateral: Secondary | ICD-10-CM | POA: Diagnosis not present

## 2022-07-26 DIAGNOSIS — H353211 Exudative age-related macular degeneration, right eye, with active choroidal neovascularization: Secondary | ICD-10-CM | POA: Diagnosis not present

## 2022-07-26 DIAGNOSIS — H25813 Combined forms of age-related cataract, bilateral: Secondary | ICD-10-CM | POA: Diagnosis not present

## 2022-07-26 DIAGNOSIS — H353122 Nonexudative age-related macular degeneration, left eye, intermediate dry stage: Secondary | ICD-10-CM | POA: Diagnosis not present

## 2022-09-22 DIAGNOSIS — H353211 Exudative age-related macular degeneration, right eye, with active choroidal neovascularization: Secondary | ICD-10-CM | POA: Diagnosis not present

## 2022-09-22 DIAGNOSIS — H25813 Combined forms of age-related cataract, bilateral: Secondary | ICD-10-CM | POA: Diagnosis not present

## 2022-09-22 DIAGNOSIS — H43813 Vitreous degeneration, bilateral: Secondary | ICD-10-CM | POA: Diagnosis not present

## 2022-09-22 DIAGNOSIS — H353122 Nonexudative age-related macular degeneration, left eye, intermediate dry stage: Secondary | ICD-10-CM | POA: Diagnosis not present

## 2022-09-27 DIAGNOSIS — S33140A Subluxation of L4/L5 lumbar vertebra, initial encounter: Secondary | ICD-10-CM | POA: Diagnosis not present

## 2022-09-27 DIAGNOSIS — M9914 Subluxation complex (vertebral) of sacral region: Secondary | ICD-10-CM | POA: Diagnosis not present

## 2022-09-27 DIAGNOSIS — M9915 Subluxation complex (vertebral) of pelvic region: Secondary | ICD-10-CM | POA: Diagnosis not present

## 2022-10-02 DIAGNOSIS — M9914 Subluxation complex (vertebral) of sacral region: Secondary | ICD-10-CM | POA: Diagnosis not present

## 2022-10-02 DIAGNOSIS — M9915 Subluxation complex (vertebral) of pelvic region: Secondary | ICD-10-CM | POA: Diagnosis not present

## 2022-10-02 DIAGNOSIS — S13120D Subluxation of C1/C2 cervical vertebrae, subsequent encounter: Secondary | ICD-10-CM | POA: Diagnosis not present

## 2022-10-02 DIAGNOSIS — S23152D Subluxation of T9/T10 thoracic vertebra, subsequent encounter: Secondary | ICD-10-CM | POA: Diagnosis not present

## 2022-10-02 DIAGNOSIS — S33140A Subluxation of L4/L5 lumbar vertebra, initial encounter: Secondary | ICD-10-CM | POA: Diagnosis not present

## 2022-10-04 DIAGNOSIS — M9914 Subluxation complex (vertebral) of sacral region: Secondary | ICD-10-CM | POA: Diagnosis not present

## 2022-10-04 DIAGNOSIS — S33140A Subluxation of L4/L5 lumbar vertebra, initial encounter: Secondary | ICD-10-CM | POA: Diagnosis not present

## 2022-10-04 DIAGNOSIS — S13120D Subluxation of C1/C2 cervical vertebrae, subsequent encounter: Secondary | ICD-10-CM | POA: Diagnosis not present

## 2022-10-04 DIAGNOSIS — S23152D Subluxation of T9/T10 thoracic vertebra, subsequent encounter: Secondary | ICD-10-CM | POA: Diagnosis not present

## 2022-10-04 DIAGNOSIS — M9915 Subluxation complex (vertebral) of pelvic region: Secondary | ICD-10-CM | POA: Diagnosis not present

## 2022-10-06 DIAGNOSIS — M9915 Subluxation complex (vertebral) of pelvic region: Secondary | ICD-10-CM | POA: Diagnosis not present

## 2022-10-06 DIAGNOSIS — S23152D Subluxation of T9/T10 thoracic vertebra, subsequent encounter: Secondary | ICD-10-CM | POA: Diagnosis not present

## 2022-10-06 DIAGNOSIS — M9914 Subluxation complex (vertebral) of sacral region: Secondary | ICD-10-CM | POA: Diagnosis not present

## 2022-10-06 DIAGNOSIS — S33140A Subluxation of L4/L5 lumbar vertebra, initial encounter: Secondary | ICD-10-CM | POA: Diagnosis not present

## 2022-10-06 DIAGNOSIS — S13120D Subluxation of C1/C2 cervical vertebrae, subsequent encounter: Secondary | ICD-10-CM | POA: Diagnosis not present

## 2022-10-10 DIAGNOSIS — B9689 Other specified bacterial agents as the cause of diseases classified elsewhere: Secondary | ICD-10-CM | POA: Diagnosis not present

## 2022-10-10 DIAGNOSIS — J329 Chronic sinusitis, unspecified: Secondary | ICD-10-CM | POA: Diagnosis not present

## 2022-10-17 DIAGNOSIS — S23152D Subluxation of T9/T10 thoracic vertebra, subsequent encounter: Secondary | ICD-10-CM | POA: Diagnosis not present

## 2022-10-17 DIAGNOSIS — S13120D Subluxation of C1/C2 cervical vertebrae, subsequent encounter: Secondary | ICD-10-CM | POA: Diagnosis not present

## 2022-10-17 DIAGNOSIS — S33140A Subluxation of L4/L5 lumbar vertebra, initial encounter: Secondary | ICD-10-CM | POA: Diagnosis not present

## 2022-10-17 DIAGNOSIS — M9915 Subluxation complex (vertebral) of pelvic region: Secondary | ICD-10-CM | POA: Diagnosis not present

## 2022-10-17 DIAGNOSIS — M9914 Subluxation complex (vertebral) of sacral region: Secondary | ICD-10-CM | POA: Diagnosis not present

## 2022-10-17 DIAGNOSIS — Z23 Encounter for immunization: Secondary | ICD-10-CM | POA: Diagnosis not present

## 2022-10-25 DIAGNOSIS — S23152D Subluxation of T9/T10 thoracic vertebra, subsequent encounter: Secondary | ICD-10-CM | POA: Diagnosis not present

## 2022-10-25 DIAGNOSIS — M9915 Subluxation complex (vertebral) of pelvic region: Secondary | ICD-10-CM | POA: Diagnosis not present

## 2022-10-25 DIAGNOSIS — S33140A Subluxation of L4/L5 lumbar vertebra, initial encounter: Secondary | ICD-10-CM | POA: Diagnosis not present

## 2022-10-25 DIAGNOSIS — S13120D Subluxation of C1/C2 cervical vertebrae, subsequent encounter: Secondary | ICD-10-CM | POA: Diagnosis not present

## 2022-10-25 DIAGNOSIS — M9914 Subluxation complex (vertebral) of sacral region: Secondary | ICD-10-CM | POA: Diagnosis not present

## 2022-10-27 DIAGNOSIS — Z23 Encounter for immunization: Secondary | ICD-10-CM | POA: Diagnosis not present

## 2022-10-30 DIAGNOSIS — S33140A Subluxation of L4/L5 lumbar vertebra, initial encounter: Secondary | ICD-10-CM | POA: Diagnosis not present

## 2022-10-30 DIAGNOSIS — S23152D Subluxation of T9/T10 thoracic vertebra, subsequent encounter: Secondary | ICD-10-CM | POA: Diagnosis not present

## 2022-10-30 DIAGNOSIS — S13120D Subluxation of C1/C2 cervical vertebrae, subsequent encounter: Secondary | ICD-10-CM | POA: Diagnosis not present

## 2022-10-30 DIAGNOSIS — M9915 Subluxation complex (vertebral) of pelvic region: Secondary | ICD-10-CM | POA: Diagnosis not present

## 2022-10-30 DIAGNOSIS — M9914 Subluxation complex (vertebral) of sacral region: Secondary | ICD-10-CM | POA: Diagnosis not present

## 2022-11-01 DIAGNOSIS — M9915 Subluxation complex (vertebral) of pelvic region: Secondary | ICD-10-CM | POA: Diagnosis not present

## 2022-11-01 DIAGNOSIS — S13120D Subluxation of C1/C2 cervical vertebrae, subsequent encounter: Secondary | ICD-10-CM | POA: Diagnosis not present

## 2022-11-01 DIAGNOSIS — S23152D Subluxation of T9/T10 thoracic vertebra, subsequent encounter: Secondary | ICD-10-CM | POA: Diagnosis not present

## 2022-11-01 DIAGNOSIS — M9914 Subluxation complex (vertebral) of sacral region: Secondary | ICD-10-CM | POA: Diagnosis not present

## 2022-11-01 DIAGNOSIS — S33140A Subluxation of L4/L5 lumbar vertebra, initial encounter: Secondary | ICD-10-CM | POA: Diagnosis not present

## 2022-11-14 DIAGNOSIS — S13120D Subluxation of C1/C2 cervical vertebrae, subsequent encounter: Secondary | ICD-10-CM | POA: Diagnosis not present

## 2022-11-14 DIAGNOSIS — S23152D Subluxation of T9/T10 thoracic vertebra, subsequent encounter: Secondary | ICD-10-CM | POA: Diagnosis not present

## 2022-11-14 DIAGNOSIS — M9914 Subluxation complex (vertebral) of sacral region: Secondary | ICD-10-CM | POA: Diagnosis not present

## 2022-11-14 DIAGNOSIS — S33140A Subluxation of L4/L5 lumbar vertebra, initial encounter: Secondary | ICD-10-CM | POA: Diagnosis not present

## 2022-11-14 DIAGNOSIS — M9915 Subluxation complex (vertebral) of pelvic region: Secondary | ICD-10-CM | POA: Diagnosis not present

## 2022-12-01 DIAGNOSIS — H25813 Combined forms of age-related cataract, bilateral: Secondary | ICD-10-CM | POA: Diagnosis not present

## 2022-12-01 DIAGNOSIS — H43813 Vitreous degeneration, bilateral: Secondary | ICD-10-CM | POA: Diagnosis not present

## 2022-12-01 DIAGNOSIS — H353122 Nonexudative age-related macular degeneration, left eye, intermediate dry stage: Secondary | ICD-10-CM | POA: Diagnosis not present

## 2022-12-01 DIAGNOSIS — H353211 Exudative age-related macular degeneration, right eye, with active choroidal neovascularization: Secondary | ICD-10-CM | POA: Diagnosis not present

## 2022-12-18 DIAGNOSIS — H353211 Exudative age-related macular degeneration, right eye, with active choroidal neovascularization: Secondary | ICD-10-CM | POA: Diagnosis not present

## 2023-01-04 DIAGNOSIS — H25813 Combined forms of age-related cataract, bilateral: Secondary | ICD-10-CM | POA: Diagnosis not present

## 2023-01-04 DIAGNOSIS — H5319 Other subjective visual disturbances: Secondary | ICD-10-CM | POA: Diagnosis not present

## 2023-01-04 DIAGNOSIS — H353232 Exudative age-related macular degeneration, bilateral, with inactive choroidal neovascularization: Secondary | ICD-10-CM | POA: Diagnosis not present

## 2023-01-04 DIAGNOSIS — H43813 Vitreous degeneration, bilateral: Secondary | ICD-10-CM | POA: Diagnosis not present

## 2023-01-17 DIAGNOSIS — H6121 Impacted cerumen, right ear: Secondary | ICD-10-CM | POA: Diagnosis not present

## 2023-01-17 DIAGNOSIS — J329 Chronic sinusitis, unspecified: Secondary | ICD-10-CM | POA: Diagnosis not present

## 2023-01-17 DIAGNOSIS — H353211 Exudative age-related macular degeneration, right eye, with active choroidal neovascularization: Secondary | ICD-10-CM | POA: Diagnosis not present

## 2023-02-03 DIAGNOSIS — M542 Cervicalgia: Secondary | ICD-10-CM | POA: Diagnosis not present

## 2023-02-03 DIAGNOSIS — G959 Disease of spinal cord, unspecified: Secondary | ICD-10-CM | POA: Diagnosis not present

## 2023-02-03 DIAGNOSIS — M4696 Unspecified inflammatory spondylopathy, lumbar region: Secondary | ICD-10-CM | POA: Diagnosis not present

## 2023-02-09 ENCOUNTER — Other Ambulatory Visit: Payer: Self-pay | Admitting: Orthopedic Surgery

## 2023-02-09 DIAGNOSIS — M545 Low back pain, unspecified: Secondary | ICD-10-CM

## 2023-02-09 DIAGNOSIS — M542 Cervicalgia: Secondary | ICD-10-CM

## 2023-02-12 ENCOUNTER — Other Ambulatory Visit: Payer: Self-pay | Admitting: Family Medicine

## 2023-02-12 DIAGNOSIS — Z0000 Encounter for general adult medical examination without abnormal findings: Secondary | ICD-10-CM

## 2023-02-20 ENCOUNTER — Other Ambulatory Visit: Payer: Medicare Other

## 2023-02-22 DIAGNOSIS — H353211 Exudative age-related macular degeneration, right eye, with active choroidal neovascularization: Secondary | ICD-10-CM | POA: Diagnosis not present

## 2023-02-26 ENCOUNTER — Other Ambulatory Visit: Payer: Medicare Other

## 2023-02-28 ENCOUNTER — Ambulatory Visit
Admission: RE | Admit: 2023-02-28 | Discharge: 2023-02-28 | Disposition: A | Discharge status: Home / Self Care | Payer: Medicare Other | Source: Ambulatory Visit | Attending: Family Medicine | Admitting: Family Medicine

## 2023-02-28 DIAGNOSIS — M19071 Primary osteoarthritis, right ankle and foot: Secondary | ICD-10-CM | POA: Diagnosis not present

## 2023-02-28 DIAGNOSIS — Z0000 Encounter for general adult medical examination without abnormal findings: Secondary | ICD-10-CM

## 2023-02-28 DIAGNOSIS — M792 Neuralgia and neuritis, unspecified: Secondary | ICD-10-CM | POA: Diagnosis not present

## 2023-02-28 DIAGNOSIS — M7752 Other enthesopathy of left foot: Secondary | ICD-10-CM | POA: Diagnosis not present

## 2023-02-28 DIAGNOSIS — M2042 Other hammer toe(s) (acquired), left foot: Secondary | ICD-10-CM | POA: Diagnosis not present

## 2023-02-28 DIAGNOSIS — Z1231 Encounter for screening mammogram for malignant neoplasm of breast: Secondary | ICD-10-CM | POA: Diagnosis not present

## 2023-02-28 DIAGNOSIS — M19072 Primary osteoarthritis, left ankle and foot: Secondary | ICD-10-CM | POA: Diagnosis not present

## 2023-02-28 DIAGNOSIS — M2041 Other hammer toe(s) (acquired), right foot: Secondary | ICD-10-CM | POA: Diagnosis not present

## 2023-03-07 ENCOUNTER — Ambulatory Visit
Admission: RE | Admit: 2023-03-07 | Discharge: 2023-03-07 | Disposition: A | Discharge status: Home / Self Care | Payer: Medicare Other | Source: Ambulatory Visit | Attending: Orthopedic Surgery | Admitting: Orthopedic Surgery

## 2023-03-07 DIAGNOSIS — M545 Low back pain, unspecified: Secondary | ICD-10-CM

## 2023-03-07 DIAGNOSIS — M542 Cervicalgia: Secondary | ICD-10-CM

## 2023-03-12 DIAGNOSIS — M47816 Spondylosis without myelopathy or radiculopathy, lumbar region: Secondary | ICD-10-CM | POA: Diagnosis not present

## 2023-03-12 DIAGNOSIS — M47812 Spondylosis without myelopathy or radiculopathy, cervical region: Secondary | ICD-10-CM | POA: Diagnosis not present

## 2023-03-15 DIAGNOSIS — H353211 Exudative age-related macular degeneration, right eye, with active choroidal neovascularization: Secondary | ICD-10-CM | POA: Diagnosis not present

## 2023-03-19 DIAGNOSIS — E559 Vitamin D deficiency, unspecified: Secondary | ICD-10-CM | POA: Diagnosis not present

## 2023-03-19 DIAGNOSIS — Z0000 Encounter for general adult medical examination without abnormal findings: Secondary | ICD-10-CM | POA: Diagnosis not present

## 2023-03-19 DIAGNOSIS — M8580 Other specified disorders of bone density and structure, unspecified site: Secondary | ICD-10-CM | POA: Diagnosis not present

## 2023-03-19 DIAGNOSIS — Z803 Family history of malignant neoplasm of breast: Secondary | ICD-10-CM | POA: Diagnosis not present

## 2023-03-19 DIAGNOSIS — E663 Overweight: Secondary | ICD-10-CM | POA: Diagnosis not present

## 2023-03-19 DIAGNOSIS — E785 Hyperlipidemia, unspecified: Secondary | ICD-10-CM | POA: Diagnosis not present

## 2023-03-19 DIAGNOSIS — Z7185 Encounter for immunization safety counseling: Secondary | ICD-10-CM | POA: Diagnosis not present

## 2023-03-19 DIAGNOSIS — H9313 Tinnitus, bilateral: Secondary | ICD-10-CM | POA: Diagnosis not present

## 2023-03-19 DIAGNOSIS — M47816 Spondylosis without myelopathy or radiculopathy, lumbar region: Secondary | ICD-10-CM | POA: Diagnosis not present

## 2023-03-19 DIAGNOSIS — Z6827 Body mass index (BMI) 27.0-27.9, adult: Secondary | ICD-10-CM | POA: Diagnosis not present

## 2023-03-19 DIAGNOSIS — Z1211 Encounter for screening for malignant neoplasm of colon: Secondary | ICD-10-CM | POA: Diagnosis not present

## 2023-03-19 DIAGNOSIS — M542 Cervicalgia: Secondary | ICD-10-CM | POA: Diagnosis not present

## 2023-03-22 DIAGNOSIS — M792 Neuralgia and neuritis, unspecified: Secondary | ICD-10-CM | POA: Diagnosis not present

## 2023-03-22 DIAGNOSIS — M2042 Other hammer toe(s) (acquired), left foot: Secondary | ICD-10-CM | POA: Diagnosis not present

## 2023-03-22 DIAGNOSIS — M19071 Primary osteoarthritis, right ankle and foot: Secondary | ICD-10-CM | POA: Diagnosis not present

## 2023-03-22 DIAGNOSIS — M2041 Other hammer toe(s) (acquired), right foot: Secondary | ICD-10-CM | POA: Diagnosis not present

## 2023-03-22 DIAGNOSIS — M7752 Other enthesopathy of left foot: Secondary | ICD-10-CM | POA: Diagnosis not present

## 2023-03-22 DIAGNOSIS — M19072 Primary osteoarthritis, left ankle and foot: Secondary | ICD-10-CM | POA: Diagnosis not present

## 2023-04-10 DIAGNOSIS — H353211 Exudative age-related macular degeneration, right eye, with active choroidal neovascularization: Secondary | ICD-10-CM | POA: Diagnosis not present

## 2023-04-19 DIAGNOSIS — H353121 Nonexudative age-related macular degeneration, left eye, early dry stage: Secondary | ICD-10-CM | POA: Diagnosis not present

## 2023-04-19 DIAGNOSIS — H35363 Drusen (degenerative) of macula, bilateral: Secondary | ICD-10-CM | POA: Diagnosis not present

## 2023-04-19 DIAGNOSIS — H353211 Exudative age-related macular degeneration, right eye, with active choroidal neovascularization: Secondary | ICD-10-CM | POA: Diagnosis not present

## 2023-04-27 DIAGNOSIS — R208 Other disturbances of skin sensation: Secondary | ICD-10-CM | POA: Diagnosis not present

## 2023-05-04 DIAGNOSIS — H353211 Exudative age-related macular degeneration, right eye, with active choroidal neovascularization: Secondary | ICD-10-CM | POA: Diagnosis not present

## 2023-05-09 DIAGNOSIS — H353211 Exudative age-related macular degeneration, right eye, with active choroidal neovascularization: Secondary | ICD-10-CM | POA: Diagnosis not present

## 2023-06-05 DIAGNOSIS — M792 Neuralgia and neuritis, unspecified: Secondary | ICD-10-CM | POA: Diagnosis not present

## 2023-06-05 DIAGNOSIS — M7752 Other enthesopathy of left foot: Secondary | ICD-10-CM | POA: Diagnosis not present

## 2023-06-05 DIAGNOSIS — M19072 Primary osteoarthritis, left ankle and foot: Secondary | ICD-10-CM | POA: Diagnosis not present

## 2023-06-05 DIAGNOSIS — H903 Sensorineural hearing loss, bilateral: Secondary | ICD-10-CM | POA: Diagnosis not present

## 2023-06-05 DIAGNOSIS — M2042 Other hammer toe(s) (acquired), left foot: Secondary | ICD-10-CM | POA: Diagnosis not present

## 2023-06-05 DIAGNOSIS — H353211 Exudative age-related macular degeneration, right eye, with active choroidal neovascularization: Secondary | ICD-10-CM | POA: Diagnosis not present

## 2023-06-05 DIAGNOSIS — M2041 Other hammer toe(s) (acquired), right foot: Secondary | ICD-10-CM | POA: Diagnosis not present

## 2023-06-05 DIAGNOSIS — M19071 Primary osteoarthritis, right ankle and foot: Secondary | ICD-10-CM | POA: Diagnosis not present

## 2023-06-06 DIAGNOSIS — R208 Other disturbances of skin sensation: Secondary | ICD-10-CM | POA: Diagnosis not present

## 2023-06-06 DIAGNOSIS — L299 Pruritus, unspecified: Secondary | ICD-10-CM | POA: Diagnosis not present

## 2023-08-03 DIAGNOSIS — H353211 Exudative age-related macular degeneration, right eye, with active choroidal neovascularization: Secondary | ICD-10-CM | POA: Diagnosis not present

## 2023-08-03 DIAGNOSIS — H25813 Combined forms of age-related cataract, bilateral: Secondary | ICD-10-CM | POA: Diagnosis not present

## 2023-08-03 DIAGNOSIS — H353122 Nonexudative age-related macular degeneration, left eye, intermediate dry stage: Secondary | ICD-10-CM | POA: Diagnosis not present

## 2023-08-03 DIAGNOSIS — H43813 Vitreous degeneration, bilateral: Secondary | ICD-10-CM | POA: Diagnosis not present

## 2023-10-04 DIAGNOSIS — H35361 Drusen (degenerative) of macula, right eye: Secondary | ICD-10-CM | POA: Diagnosis not present

## 2023-10-04 DIAGNOSIS — H35362 Drusen (degenerative) of macula, left eye: Secondary | ICD-10-CM | POA: Diagnosis not present

## 2023-10-29 DIAGNOSIS — Z23 Encounter for immunization: Secondary | ICD-10-CM | POA: Diagnosis not present

## 2023-10-30 DIAGNOSIS — H43813 Vitreous degeneration, bilateral: Secondary | ICD-10-CM | POA: Diagnosis not present

## 2023-10-30 DIAGNOSIS — H25813 Combined forms of age-related cataract, bilateral: Secondary | ICD-10-CM | POA: Diagnosis not present

## 2023-10-30 DIAGNOSIS — H353211 Exudative age-related macular degeneration, right eye, with active choroidal neovascularization: Secondary | ICD-10-CM | POA: Diagnosis not present

## 2023-10-30 DIAGNOSIS — H353122 Nonexudative age-related macular degeneration, left eye, intermediate dry stage: Secondary | ICD-10-CM | POA: Diagnosis not present

## 2023-11-05 DIAGNOSIS — Z23 Encounter for immunization: Secondary | ICD-10-CM | POA: Diagnosis not present

## 2023-12-11 DIAGNOSIS — J0191 Acute recurrent sinusitis, unspecified: Secondary | ICD-10-CM | POA: Diagnosis not present

## 2024-01-02 DIAGNOSIS — M542 Cervicalgia: Secondary | ICD-10-CM | POA: Diagnosis not present
